# Patient Record
Sex: Male | Born: 1980 | Race: White | Hispanic: No | Marital: Married | Smoking: Former smoker
Health system: Southern US, Community
[De-identification: ages and names within clinical notes are randomized; demographics above are authoritative.]

## PROBLEM LIST (undated history)

## (undated) DIAGNOSIS — F410 Panic disorder [episodic paroxysmal anxiety] without agoraphobia: Secondary | ICD-10-CM

## (undated) HISTORY — PX: TYMPANOPLASTY WITH GRAFT: SHX6567

## (undated) HISTORY — PX: TONSILLECTOMY: SHX5217

## (undated) HISTORY — DX: Panic disorder (episodic paroxysmal anxiety): F41.0

## (undated) HISTORY — PX: TYMPANOSTOMY TUBE PLACEMENT: SHX32

## (undated) HISTORY — PX: HERNIA REPAIR: SHX51

---

## 2002-12-20 ENCOUNTER — Emergency Department (HOSPITAL_COMMUNITY): Admission: EM | Admit: 2002-12-20 | Discharge: 2002-12-20 | Payer: Self-pay | Admitting: Emergency Medicine

## 2002-12-20 ENCOUNTER — Encounter: Payer: Self-pay | Admitting: Emergency Medicine

## 2005-01-21 ENCOUNTER — Ambulatory Visit: Payer: Self-pay | Admitting: Family Medicine

## 2005-02-04 ENCOUNTER — Ambulatory Visit: Payer: Self-pay | Admitting: Family Medicine

## 2005-04-16 ENCOUNTER — Ambulatory Visit: Payer: Self-pay | Admitting: Family Medicine

## 2007-01-11 ENCOUNTER — Ambulatory Visit: Payer: Self-pay | Admitting: Family Medicine

## 2007-01-28 ENCOUNTER — Ambulatory Visit (HOSPITAL_BASED_OUTPATIENT_CLINIC_OR_DEPARTMENT_OTHER): Admission: RE | Admit: 2007-01-28 | Discharge: 2007-01-28 | Payer: Self-pay | Admitting: Otolaryngology

## 2008-01-05 ENCOUNTER — Telehealth: Payer: Self-pay | Admitting: Family Medicine

## 2008-01-11 ENCOUNTER — Ambulatory Visit: Payer: Self-pay | Admitting: Family Medicine

## 2008-01-11 DIAGNOSIS — F411 Generalized anxiety disorder: Secondary | ICD-10-CM | POA: Insufficient documentation

## 2008-11-30 ENCOUNTER — Ambulatory Visit: Payer: Self-pay | Admitting: Internal Medicine

## 2008-11-30 DIAGNOSIS — L723 Sebaceous cyst: Secondary | ICD-10-CM | POA: Insufficient documentation

## 2009-01-14 ENCOUNTER — Ambulatory Visit: Payer: Self-pay | Admitting: Family Medicine

## 2009-03-05 DIAGNOSIS — M51379 Other intervertebral disc degeneration, lumbosacral region without mention of lumbar back pain or lower extremity pain: Secondary | ICD-10-CM | POA: Insufficient documentation

## 2009-03-05 DIAGNOSIS — M5137 Other intervertebral disc degeneration, lumbosacral region: Secondary | ICD-10-CM | POA: Insufficient documentation

## 2009-03-06 ENCOUNTER — Ambulatory Visit: Payer: Self-pay | Admitting: Family Medicine

## 2009-03-08 ENCOUNTER — Ambulatory Visit: Payer: Self-pay | Admitting: Family Medicine

## 2009-03-11 ENCOUNTER — Telehealth (INDEPENDENT_AMBULATORY_CARE_PROVIDER_SITE_OTHER): Payer: Self-pay | Admitting: *Deleted

## 2009-03-14 ENCOUNTER — Telehealth: Payer: Self-pay | Admitting: Family Medicine

## 2009-03-21 ENCOUNTER — Encounter: Admission: RE | Admit: 2009-03-21 | Discharge: 2009-04-15 | Payer: Self-pay | Admitting: Family Medicine

## 2009-04-16 ENCOUNTER — Encounter: Payer: Self-pay | Admitting: Family Medicine

## 2009-05-06 ENCOUNTER — Ambulatory Visit: Payer: Self-pay | Admitting: Family Medicine

## 2009-05-06 DIAGNOSIS — M546 Pain in thoracic spine: Secondary | ICD-10-CM | POA: Insufficient documentation

## 2009-08-02 ENCOUNTER — Ambulatory Visit: Payer: Self-pay | Admitting: Family Medicine

## 2010-04-14 ENCOUNTER — Ambulatory Visit: Payer: Self-pay | Admitting: Family Medicine

## 2010-06-24 ENCOUNTER — Ambulatory Visit: Payer: Self-pay | Admitting: Family Medicine

## 2010-06-24 DIAGNOSIS — H6980 Other specified disorders of Eustachian tube, unspecified ear: Secondary | ICD-10-CM | POA: Insufficient documentation

## 2010-10-21 ENCOUNTER — Ambulatory Visit
Admission: RE | Admit: 2010-10-21 | Discharge: 2010-10-21 | Payer: Self-pay | Source: Home / Self Care | Attending: Family Medicine | Admitting: Family Medicine

## 2010-10-23 ENCOUNTER — Encounter
Admission: RE | Admit: 2010-10-23 | Discharge: 2010-10-28 | Payer: Self-pay | Source: Home / Self Care | Attending: Nurse Practitioner | Admitting: Nurse Practitioner

## 2010-10-28 NOTE — Assessment & Plan Note (Signed)
Summary: check bumps//ccm   Vital Signs:  Patient profile:   30 year old male Height:      74 inches Weight:      250 pounds BMI:     32.21 Temp:     98.3 degrees F oral BP sitting:   120 / 84  (left arm) Cuff size:   regular  Vitals Entered By: Kern Reap CMA Duncan Dull) (April 14, 2010 4:23 PM) CC: rash on left arm   CC:  rash on left arm.  History of Present Illness: Darius Stone is a 30 year old male, who comes in today for evaluation of rash on his left antecubital fossa.  Six days ago he woke up in the morning and noticed 5 red bumps in the antecubital fossa.  There is no preceding pain.  It's not pruritic... The rash is  the same  now than it was 6 days ago.  Allergies: 1)  ! Penicillin  Review of Systems      See HPI  Physical Exam  General:  Well-developed,well-nourished,in no acute distress; alert,appropriate and cooperative throughout examination Skin:  5 papular lesions, left antecubital fossa, consistent with spider bites   Problems:  Medical Problems Added: 1)  Dx of Bite of Nonvenomous Arthropod  (ICD-E906.4)  Impression & Recommendations:  Problem # 1:  BITE OF NONVENOMOUS ARTHROPOD (ICD-E906.4) Assessment New  Patient Instructions: 1)  apply cortisone cream twice daily............ call immediately if any sign of infection

## 2010-10-28 NOTE — Assessment & Plan Note (Signed)
Summary: ear clogged//ccm   Vital Signs:  Patient profile:   30 year old male Weight:      220 pounds Temp:     98.1 degrees F oral BP sitting:   120 / 88  (left arm) Cuff size:   regular  Vitals Entered By: Kern Reap CMA Duncan Dull) (June 24, 2010 12:19 PM) CC: clogged ear, drainage   CC:  clogged ear and drainage.  History of Present Illness: Darius Stone is a 30 year old male, who comes in today for evaluation of hearing loss in his right ear x 2 days.  Sunday he noticed some hearing loss in his right ear.  The week prior.  He had a head cold with a lot of nasal edema.  No fever, chills, or pain.  Allergies: 1)  ! Penicillin  Past History:  Past medical, surgical, family and social histories (including risk factors) reviewed for relevance to current acute and chronic problems.  Past Medical History: Reviewed history from 01/11/2008 and no changes required. PE tubes for serous otitis media, perforation that did not heal, repair, now 8 tonsillectomy right and left hernia repair history of panic attacks  Family History: Reviewed history from 01/11/2008 and no changes required. father has a history of degenerative joint disease.  Has had both hips replaced and he says his father drinks alcohol excessively.  Mother in good health.  No brothers.  One sister, who had melanoma  Social History: Reviewed history from 01/11/2008 and no changes required. Occupation: Single Former Smoker Alcohol use-yes Drug use-yes Regular exercise-yes  Review of Systems      See HPI  Physical Exam  General:  Well-developed,well-nourished,in no acute distress; alert,appropriate and cooperative throughout examination Head:  Normocephalic and atraumatic without obvious abnormalities. No apparent alopecia or balding. Eyes:  No corneal or conjunctival inflammation noted. EOMI. Perrla. Funduscopic exam benign, without hemorrhages, exudates or papilledema. Vision grossly normal. Ears:   left ear canal and eardrum normal.  Right ear canal normal ............. tympanic membrane not movable   Problems:  Medical Problems Added: 1)  Dx of Eustachian Tube Dysfunction, Right  (ICD-381.81)  Impression & Recommendations:  Problem # 1:  EUSTACHIAN TUBE DYSFUNCTION, RIGHT (ICD-381.81) Assessment New  Complete Medication List: 1)  Flonase 50 Mcg/act Susp (Fluticasone propionate) .... Uad  Patient Instructions: 1)  begin Zyrtec 10 mg a day at bedtime, one shot of Afrin nasal spray, up y  right nostril bedtime, followed by one shot  of the steroid nasal spray up y  right nostril at bedtime.  Do this until you hearing returns to normal. 2)  Stay in a complete smoke-free environment Prescriptions: FLONASE 50 MCG/ACT SUSP (FLUTICASONE PROPIONATE) UAD  #1 x 1   Entered and Authorized by:   Roderick Pee MD   Signed by:   Roderick Pee MD on 06/24/2010   Method used:   Print then Give to Patient   RxID:   [id number]

## 2010-10-30 NOTE — Assessment & Plan Note (Signed)
Summary: back pain/115p/njr   Vital Signs:  Patient profile:   30 year old male Weight:      280 pounds Temp:     98.2 degrees F oral BP sitting:   120 / 86  (left arm) Cuff size:   regular  Vitals Entered By: Kern Reap CMA Duncan Dull) (October 21, 2010 1:26 PM) CC: lower back pain   CC:  lower back pain.  History of Present Illness: Darius Stone is a 30 year old single male, nonsmoker, who works as a Science writer for a [employer], who comes in today for reevaluation of back pain.  About 16 months ago.  We saw him for the same problem.  Evaluation and it was negative at that time, however, because of the persistent pain.  I referred him for a neurosurgical evaluation.  He saw Dr. Danielle Dess, who did a complete diagnostic workup, including an MRI and totally thought his spine was fairly normal except for the L5-S1 disk.  He advised physical therapy and weight loss.  He went to one physical therapy session.  The pain went away and he never went back.  He's not lost any weight.  His current weight is 280 pounds in about 4 months ago.  The pain recurred.  He describes it now is constant right lumbar 4 on a scale of one to 10 and dull.  It seems to radiate down his right lateral thigh.  Neurologic review of systems is negative  Allergies: 1)  ! Penicillin  Past History:  Past medical, surgical, family and social histories (including risk factors) reviewed for relevance to current acute and chronic problems.  Past Medical History: Reviewed history from 01/11/2008 and no changes required. PE tubes for serous otitis media, perforation that did not heal, repair, now 8 tonsillectomy right and left hernia repair history of panic attacks  Family History: Reviewed history from 01/11/2008 and no changes required. father has a history of degenerative joint disease.  Has had both hips replaced and he says his father drinks alcohol excessively.  Mother in good health.  No brothers.  One sister, who  had melanoma  Social History: Reviewed history from 01/11/2008 and no changes required. Occupation: Single Former Smoker Alcohol use-yes Drug use-yes Regular exercise-yes  Review of Systems      See HPI  Physical Exam  General:  Well-developed,well-nourished,in no acute distress; alert,appropriate and cooperative throughout examination Msk:  No deformity or scoliosis noted of thoracic or lumbar spine.   Pulses:  R and L carotid,radial,femoral,dorsalis pedis and posterior tibial pulses are full and equal bilaterally Extremities:  No clubbing, cyanosis, edema, or deformity noted with normal full range of motion of all joints.   Neurologic:  No cranial nerve deficits noted. Station and gait are normal. Plantar reflexes are down-going bilaterally. DTRs are symmetrical throughout. Sensory, motor and coordinative functions appear intact.   Impression & Recommendations:  Problem # 1:  DISC DISEASE, LUMBAR (ICD-722.52) Assessment Deteriorated  Orders: Physical Therapy Referral (PT)  Complete Medication List: 1)  Flonase 50 Mcg/act Susp (Fluticasone propionate) .... Uad 2)  Flexeril 10 Mg Tabs (Cyclobenzaprine hcl) .Marland Kitchen.. 1 tab @ bedtime 3)  Vicodin Es 7.5-750 Mg Tabs (Hydrocodone-acetaminophen) .Marland Kitchen.. 1 tab @ bedtime  Patient Instructions: 1)  begin Motrin 800 mg twice daily with food, and a half of a Flexeril and Vicodin at bedtime. 2)  Avoid sitting. 3)  At work get up and walk around every 30 minutes. 4)  I will get u set up to see Jeanene Erb,  physical therapist Prescriptions: VICODIN ES 7.5-750 MG TABS (HYDROCODONE-ACETAMINOPHEN) 1 tab @ bedtime  #40 x 1   Entered and Authorized by:   Roderick Pee MD   Signed by:   Roderick Pee MD on 10/21/2010   Method used:   Print then Give to Patient   RxID:   [id number] FLEXERIL 10 MG TABS (CYCLOBENZAPRINE HCL) 1 tab @ bedtime  #40 x 1   Entered and Authorized by:   Roderick Pee MD   Signed by:   Roderick Pee MD on  10/21/2010   Method used:   Print then Give to Patient   RxID:   [id number]    Orders Added: 1)  Physical Therapy Referral [PT] 2)  Est. Patient Level III [88416]

## 2011-02-13 NOTE — Op Note (Signed)
NAME:  DEVELL, PARKERSON NO.:  [id number]   MEDICAL RECORD NO.:  [id number]          PATIENT TYPE:  AMB   LOCATION:  [facility]                          FACILITY:  [facility]   PHYSICIAN:  Christopher E. Ezzard Standing, M.D.DATE OF BIRTH:  02/20/1981   DATE OF PROCEDURE:  01/28/2007  DATE OF DISCHARGE:                               OPERATIVE REPORT   PREOPERATIVE DIAGNOSIS:  Chronic left tympanic membrane perforation.   POSTOPERATIVE DIAGNOSIS:  Chronic left tympanic membrane perforation.   OPERATION:  Left medial graft tympanoplasty.   SURGEON:  Narda Bonds, M.D.   ANESTHESIA:  General endotracheal.   COMPLICATIONS:  None.   BRIEF CLINICAL NOTE:  Gamble Enderle is a 30 year old gentleman, who has  had a chronic left TM perforation for a number of years.  He has had  previous history of ear infections with multiple myringotomy tubes.  I  suspect the perforation is residual effect of myringotomy tubes.  On  examination, he has a dry central anterior TM perforation of  approximately 30%.  He has had intermittent drainage from the performed  but no drainage presently.  He is taken to the operating room at this  time for a left tympanoplasty.   DESCRIPTION OF PROCEDURE:  After adequate endotracheal anesthesia,  Taydon received 1 g of Ancef IV preoperatively.  Ear canal was prepped  and draped with Betadine solution and draped out with sterile towels.  Ear canal was then further injected with Xylocaine with epinephrine for  local anesthetic, and the ear canal was irrigated with saline.  The  perforation was evaluated.  This was anterior-inferior TM perforation.  The edges of the perforation were freshened up with a pick and cup  forceps.  A posterior based tympanomeatal flap was then elevated down to  the annulus.  Cotton pledgets soaked in adrenaline were placed for  hemostasis.  A small postauricular incision was made to harvest a  temporalis fascia graft.  This was harvested, and  the graft site was  closed with 3-0 chromic sutures subcutaneously and 5-0 nylon to  reapproximate the skin edges.  Next, ear canal was approached again.  The packs soaked in adrenalin were removed, and annulus was elevated out  of the annulus sulcus and tympanomeatal flap was elevated anteriorly.  The incus and the stapes were intact and mobile.  The fascial graft was  cut to appropriate size and was placed medial to the tympanomeatal flap  and underneath the perforation site.  The tympanomeatal flap was brought  back down, the fascial graft covered the entire perforation.  The middle  ear space was then packed with Gelfoam soaked in Ciprodex drops.  The  tympanomeatal flap was brought back down; photo was obtained of the  graft covering the perforation.  Ear canal was then packed with Gelfoam  soaked in Ciprodex.  This completed the procedure.  Mastoid dressing was  applied.  Barth was awoken from anesthesia and transferred to the  recovery room postop doing well.   DISPOSITION:  Harout is discharged home later this morning on Keflex 500  mg b.i.d. x5 days,  Tylenol and Vicodin p.r.n. pain.  We will have him  follow up in my office in 6 days for recheck and have the postauricular  sutures removed.           ______________________________  Kristine Garbe Ezzard Standing, M.D.     CEN/MEDQ  D:  01/28/2007  T:  01/28/2007  Job:  [id number]   cc:   Tinnie Gens A. Tawanna Cooler, MD

## 2011-10-13 ENCOUNTER — Telehealth: Payer: Self-pay | Admitting: Family Medicine

## 2011-10-13 NOTE — Telephone Encounter (Signed)
Started at midnight, may have food poisoning or the flu. Having vomiting and diarrhea. Wants to see Dr Tawanna Cooler this morning asap. Please advise. Thanks.

## 2011-10-13 NOTE — Telephone Encounter (Signed)
Spoke with patient and he will try a clear liquid diet today and call back if no improvement

## 2012-02-26 ENCOUNTER — Telehealth: Payer: Self-pay | Admitting: Family

## 2012-02-26 ENCOUNTER — Encounter: Payer: Self-pay | Admitting: Family

## 2012-02-26 ENCOUNTER — Ambulatory Visit (INDEPENDENT_AMBULATORY_CARE_PROVIDER_SITE_OTHER): Payer: Managed Care, Other (non HMO) | Admitting: Family

## 2012-02-26 ENCOUNTER — Ambulatory Visit (INDEPENDENT_AMBULATORY_CARE_PROVIDER_SITE_OTHER)
Admission: RE | Admit: 2012-02-26 | Discharge: 2012-02-26 | Disposition: A | Discharge status: Home / Self Care | Payer: Managed Care, Other (non HMO) | Source: Ambulatory Visit | Attending: Family | Admitting: Family

## 2012-02-26 VITALS — BP 122/74 | HR 80 | Ht 74.0 in | Wt 261.0 lb

## 2012-02-26 DIAGNOSIS — M79609 Pain in unspecified limb: Secondary | ICD-10-CM

## 2012-02-26 DIAGNOSIS — M79671 Pain in right foot: Secondary | ICD-10-CM

## 2012-02-26 MED ORDER — DICLOFENAC SODIUM 75 MG PO TBEC: 75.0000 mg | DELAYED_RELEASE_TABLET | Freq: Two times a day (BID) | ORAL | Status: DC

## 2012-02-26 MED ORDER — HYDROCODONE-ACETAMINOPHEN 5-500 MG PO TABS: 1.0000 | ORAL_TABLET | Freq: Three times a day (TID) | ORAL | Status: AC | PRN

## 2012-02-26 NOTE — Progress Notes (Signed)
  Subjective:    Patient ID: Darius Stone, male    DOB: [birth year], 31 y.o.   MRN: [id number]  HPI 31 year old [place] male, nonsmoker, patient of Dr. Tawanna Cooler is in today with complaints of right foot pain x2 days. He describes the pain as achy and sharp, rates it a 7/10. The pain is worse with with eversion, flexion and extension. He has not taken any medication for relief. Denies any current or previous injury. 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  Review of Systems  Constitutional: Negative.   HENT: Negative.   Respiratory: Negative.   Cardiovascular: Negative.   Gastrointestinal: Negative.   Musculoskeletal:       Right foot pain  Skin: Negative.   Neurological: Negative.   Hematological: Negative.   Psychiatric/Behavioral: Negative.    No past medical history on file.  History   Social History  . Marital Status: Single    Spouse Name: N/A    Number of Children: N/A  . Years of Education: N/A   Occupational History  . Not on file.   Social History Main Topics  . Smoking status: Former Games developer  . Smokeless tobacco: Not on file  . Alcohol Use: Yes  . Drug Use: Yes  . Sexually Active: Not on file   Other Topics Concern  . Not on file   Social History Narrative  . No narrative on file    No past surgical history on file.  No family history on file.  Allergies  Allergen Reactions  . Penicillins     No current outpatient prescriptions on file prior to visit.    BP 122/74  Pulse 80  Ht 6\' 2"  (1.88 m)  Wt 261 lb (118.389 kg)  BMI 33.51 kg/m2  SpO2 98%chart    Objective:   Physical Exam  Constitutional: He is oriented to person, place, and time. He appears well-developed and well-nourished.  Neck: Normal range of motion. Neck supple.  Cardiovascular: Normal rate, regular rhythm and normal heart sounds.   Pulmonary/Chest: Effort normal and breath sounds normal.  Abdominal: Soft. Bowel sounds are normal.  Musculoskeletal:   Pain with eversion, flexion and extension of the right foot. Minimal swelling.  Neurological: He is alert and oriented to person, place, and time.  Skin: Skin is warm and dry.  Psychiatric: He has a normal mood and affect.          Assessment & Plan:  Assessment: Right foot pain  Plan: X-ray of the right will notify patient of the results. Voltaren 75 mg one tablet twice a day. Vicodin one tablet every 8 hours when necessary pain. Out of work Quarry manager. Rest. Ace wrap. Patient call the office if symptoms worsen or persist. Recheck a schedule, when necessary.

## 2012-02-26 NOTE — Patient Instructions (Signed)
Sprain  A sprain happens when the bands of tissue that connect bones and hold joints together (ligaments) stretch too much or tear.  HOME CARE   Raise (elevate) the injured area to lessen puffiness (swelling).   Put ice on the injured area.   Put ice in a plastic bag.   Place a towel between your skin and the bag.   Leave the ice on for 15 to 20 minutes, 3 to 4 times a day.   Do this for the first 24 hours or as told by your doctor.   Wear any splints, braces, castings, or elastic wraps as told by your child's doctor.   Eat healthy foods.   Only take medicine as told by your doctor.  GET HELP RIGHT AWAY IF:    There is numbness or tingling in the injured limb.   The toes or fingers become blue or [place] in the injured limb.   The sprained limb is cold to the touch.   There is a sharp, shooting pain in the injured limb.   The puffiness is getting worse instead of better.  MAKE SURE YOU:    Understand these instructions.   Will watch this condition.   Will get help right away if you are not doing well or get worse.  Document Released: 03/02/2008 Document Revised: 09/03/2011 Document Reviewed: 07/31/2009  [employer] Patient Information 2012 [employer].

## 2012-02-26 NOTE — Telephone Encounter (Signed)
SEE X-RAY NOTE

## 2012-02-26 NOTE — Telephone Encounter (Signed)
Pt is call requesting xray(foot) results

## 2012-04-07 ENCOUNTER — Ambulatory Visit (INDEPENDENT_AMBULATORY_CARE_PROVIDER_SITE_OTHER): Payer: Managed Care, Other (non HMO) | Admitting: Family Medicine

## 2012-04-07 ENCOUNTER — Encounter: Payer: Self-pay | Admitting: Family Medicine

## 2012-04-07 VITALS — BP 120/80 | Temp 98.0°F | Ht 74.5 in | Wt 256.0 lb

## 2012-04-07 DIAGNOSIS — Z0000 Encounter for general adult medical examination without abnormal findings: Secondary | ICD-10-CM

## 2012-04-07 DIAGNOSIS — Z23 Encounter for immunization: Secondary | ICD-10-CM

## 2012-04-07 LAB — CBC WITH DIFFERENTIAL/PLATELET
Basophils Absolute: 0 10*3/uL (ref 0.0–0.1)
Basophils Relative: 0.3 % (ref 0.0–3.0)
Eosinophils Absolute: 0.2 10*3/uL (ref 0.0–0.7)
Eosinophils Relative: 2.6 % (ref 0.0–5.0)
HCT: 46.9 % (ref 39.0–52.0)
Hemoglobin: 15.9 g/dL (ref 13.0–17.0)
Lymphocytes Relative: 28.2 % (ref 12.0–46.0)
Lymphs Abs: 1.7 10*3/uL (ref 0.7–4.0)
MCHC: 33.9 g/dL (ref 30.0–36.0)
Monocytes Relative: 9.9 % (ref 3.0–12.0)
Neutro Abs: 3.6 10*3/uL (ref 1.4–7.7)
Neutrophils Relative %: 59 % (ref 43.0–77.0)
Platelets: 217 10*3/uL (ref 150.0–400.0)
RBC: 5.59 Mil/uL (ref 4.22–5.81)
RDW: 13.4 % (ref 11.5–14.6)
WBC: 6.2 10*3/uL (ref 4.5–10.5)

## 2012-04-07 LAB — LIPID PANEL
Cholesterol: 197 mg/dL (ref 0–200)
HDL: 66.7 mg/dL (ref >39.00)
LDL Cholesterol: 109 mg/dL — ABNORMAL HIGH (ref 0–99)
Total CHOL/HDL Ratio: 3
Triglycerides: 106 mg/dL (ref 0.0–149.0)
VLDL: 21.2 mg/dL (ref 0.0–40.0)

## 2012-04-07 LAB — POCT URINALYSIS DIPSTICK
Bilirubin, UA: NEGATIVE
Blood, UA: NEGATIVE
Ketones, UA: NEGATIVE
Leukocytes, UA: NEGATIVE
Protein, UA: NEGATIVE
Spec Grav, UA: 1.025
Urobilinogen, UA: 0.2
pH, UA: 5.5

## 2012-04-07 LAB — BASIC METABOLIC PANEL
BUN: 20 mg/dL (ref 6–23)
CO2: 26 mEq/L (ref 19–32)
Chloride: 104 mEq/L (ref 96–112)
Creatinine, Ser: 1.1 mg/dL (ref 0.4–1.5)
Glucose, Bld: 101 mg/dL — ABNORMAL HIGH (ref 70–99)
Potassium: 3.8 mEq/L (ref 3.5–5.1)

## 2012-04-07 LAB — HEPATIC FUNCTION PANEL
ALT: 46 U/L (ref 0–53)
AST: 33 U/L (ref 0–37)
Albumin: 4.5 g/dL (ref 3.5–5.2)
Alkaline Phosphatase: 68 U/L (ref 39–117)
Bilirubin, Direct: 0.1 mg/dL (ref 0.0–0.3)
Total Protein: 7.7 g/dL (ref 6.0–8.3)

## 2012-04-07 NOTE — Patient Instructions (Signed)
We will call you within a week with your laboratories  Do a thorough skin exam monthly  Return yearly for general physical examination  Have your barber check your scalp every time he gets her hair cut remember the 2 lesions one above your right ear and one behind her left ear  If the migraine headaches get more frequent or severe call for some specific migraine medications if her mild continue to use the over-the-counter medications  Begin a walking program

## 2012-04-07 NOTE — Progress Notes (Signed)
Subjective:    Patient ID: Darius Stone, male    DOB: August 30, 1981, 31 y.o.   MRN: [id number]  HPI hakan  is a 31 year old married male nonsmoker who comes in today for general physical examination  He's always been in excellent health he's had no chronic health problems. He had bilateral hernia surgery as a child and PE tubes x6  Other than that no major illnesses injuries. No medicine allergies he's never had penicillin because his father has a history of penicillin allergy and he was told not to take it because his dad was allergic. 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 Family history pertinent a sister was diagnosed at age 65 with a melanoma of her scalp. He has had 2 hip operations otherwise no family history of coronary disease. Last tetanus booster unknown booster given today  Review of systems negative except for recent migraine headache preceded by a visual aura   Review of Systems  Constitutional: Negative.   Eyes: Negative.   Respiratory: Negative.   Cardiovascular: Negative.   Gastrointestinal: Negative.   Genitourinary: Negative.   Musculoskeletal: Negative.   Skin: Negative.   Neurological: Positive for headaches.  Hematological: Negative.   Psychiatric/Behavioral: Negative.        Objective:   Physical Exam  Constitutional: He is oriented to person, place, and time. He appears well-developed and well-nourished.  HENT:  Head: Normocephalic and atraumatic.  Right Ear: External ear normal.  Left Ear: External ear normal.  Nose: Nose normal.  Mouth/Throat: Oropharynx is clear and moist.  Eyes: Conjunctivae and EOM are normal. Pupils are equal, round, and reactive to light.  Neck: Normal range of motion. Neck supple. No JVD present. No tracheal deviation present. No thyromegaly present.  Cardiovascular: Normal rate, regular rhythm, normal heart sounds and intact distal pulses.  Exam reveals no gallop  and no friction rub.   No murmur heard. Pulmonary/Chest: Effort normal and breath sounds normal. No stridor. No respiratory distress. He has no wheezes. He has no rales. He exhibits no tenderness.  Abdominal: Soft. Bowel sounds are normal. He exhibits no distension and no mass. There is no tenderness. There is no rebound and no guarding.  Genitourinary: Rectum normal, prostate normal and penis normal. Guaiac negative stool. No penile tenderness.  Musculoskeletal: Normal range of motion. He exhibits no edema and no tenderness.  Lymphadenopathy:    He has no cervical adenopathy.  Neurological: He is alert and oriented to person, place, and time. He has normal reflexes. No cranial nerve deficit. He exhibits normal muscle tone.  Skin: Skin is warm and dry. No rash noted. No erythema. No pallor.       We did a total body skin exam because of his family history of melanoma he has 2 tattoos one on the posterior right calf the other on his upper trunk both of which appear normal he has a garden variety of freckles moles skin tags and capillary hemangiomas. Also did a thorough exam of his scalp. He has 2 brown lesions one above his right ear and one behind his left ear there brown about 6 mm in diameter no dark pigment.  Psychiatric: He has a normal mood and affect. His behavior is normal. Judgment and thought content normal.          Assessment & Plan:  Healthy male  Family history of melanoma thorough skin exam monthly at home followup yearly here  Overweight  256 pounds check metabolic parameters begin diet and exercise program  Migraine headache asymptomatic now OTC medication return when necessary  History of inguinal hernia repair x2  History of PE tubes x6 normal hearing

## 2012-10-28 ENCOUNTER — Ambulatory Visit: Payer: Managed Care, Other (non HMO) | Admitting: Family Medicine

## 2012-12-04 ENCOUNTER — Encounter (HOSPITAL_COMMUNITY): Payer: Self-pay | Admitting: Emergency Medicine

## 2012-12-04 ENCOUNTER — Emergency Department (HOSPITAL_COMMUNITY)
Admission: EM | Admit: 2012-12-04 | Discharge: 2012-12-04 | Disposition: A | Discharge status: Home / Self Care | Payer: Managed Care, Other (non HMO) | Attending: Emergency Medicine | Admitting: Emergency Medicine

## 2012-12-04 DIAGNOSIS — W268XXA Contact with other sharp object(s), not elsewhere classified, initial encounter: Secondary | ICD-10-CM | POA: Insufficient documentation

## 2012-12-04 DIAGNOSIS — F1010 Alcohol abuse, uncomplicated: Secondary | ICD-10-CM | POA: Insufficient documentation

## 2012-12-04 DIAGNOSIS — Z87891 Personal history of nicotine dependence: Secondary | ICD-10-CM | POA: Insufficient documentation

## 2012-12-04 DIAGNOSIS — S61209A Unspecified open wound of unspecified finger without damage to nail, initial encounter: Secondary | ICD-10-CM | POA: Insufficient documentation

## 2012-12-04 DIAGNOSIS — Y92009 Unspecified place in unspecified non-institutional (private) residence as the place of occurrence of the external cause: Secondary | ICD-10-CM | POA: Insufficient documentation

## 2012-12-04 DIAGNOSIS — Y9389 Activity, other specified: Secondary | ICD-10-CM | POA: Insufficient documentation

## 2012-12-04 DIAGNOSIS — Z8659 Personal history of other mental and behavioral disorders: Secondary | ICD-10-CM | POA: Insufficient documentation

## 2012-12-04 MED ORDER — IBUPROFEN 600 MG PO TABS: 600.0000 mg | ORAL_TABLET | Freq: Four times a day (QID) | ORAL | Status: DC | PRN

## 2012-12-04 MED ORDER — OXYCODONE-ACETAMINOPHEN 5-325 MG PO TABS: 1.0000 | ORAL_TABLET | Freq: Four times a day (QID) | ORAL | Status: DC | PRN

## 2012-12-04 NOTE — ED Provider Notes (Signed)
History     CSN: [id number]  Arrival date & time 12/04/12  0000   First MD Initiated Contact with Patient 12/04/12 [phone]      Chief Complaint  Patient presents with  . Laceration    (Consider location/radiation/quality/duration/timing/severity/associated sxs/prior treatment) HPI Comments: Patient + EtOH presents with avulsion to left thumb that occurred while using a peeler. Patient reports loss of fingernail, + bleeding, some difficulty controlling with pressure. Last tetanus < 5 years ago per pt. Denies other injury. The onset of this condition was acute. The course is constant. Aggravating factors: none. Alleviating factors: none.    The history is provided by the patient.    Past Medical History  Diagnosis Date  . Panic attack     Past Surgical History  Procedure Laterality Date  . Tympanostomy tube placement    . Tonsillectomy    . Hernia repair      Family History  Problem Relation Age of Onset  . Alcohol abuse Father   . Cancer Sister     melanoma- scalp    History  Substance Use Topics  . Smoking status: Former Games developer  . Smokeless tobacco: Not on file  . Alcohol Use: Yes      Review of Systems  Constitutional: Negative for activity change.  HENT: Negative for neck pain.   Musculoskeletal: Positive for arthralgias. Negative for back pain, joint swelling and gait problem.  Skin: Positive for wound.  Neurological: Negative for weakness and numbness.    Allergies  Penicillins  Home Medications   Current Outpatient Rx  Name  Route  Sig  Dispense  Refill  . ibuprofen (ADVIL,MOTRIN) 200 MG tablet   Oral   Take 800 mg by mouth every 6 (six) hours as needed for pain.           BP 144/89  Pulse 77  Temp(Src) 98 F (36.7 C)  Resp 19  SpO2 99%  Physical Exam  Nursing note and vitals reviewed. Constitutional: He appears well-developed and well-nourished.  HENT:  Head: Normocephalic and atraumatic.  Eyes: Conjunctivae are normal.  Neck:  Normal range of motion. Neck supple.  Cardiovascular: Normal pulses.   Musculoskeletal: He exhibits tenderness. He exhibits no edema.       Left wrist: Normal. He exhibits normal range of motion and no tenderness.       Left hand: He exhibits laceration (avulsion). Normal sensation noted. Normal strength (Normal flexion, extension, opposition of thumb) noted.       Hands: Neurological: He is alert. No sensory deficit.  Motor, sensation, and vascular distal to the injury is fully intact.   Skin: Skin is warm and dry.  Psychiatric: He has a normal mood and affect.    ED Course  Procedures (including critical care time)  Labs Reviewed - No data to display No results found.   1. Fingertip avulsion, initial encounter     5:51 AM Patient seen and examined. Work-up initiated. Medications ordered.   Vital signs reviewed and are as follows: Filed Vitals:   12/04/12 0429  BP: 144/89  Pulse: 77  Temp:   Resp: 19   5:51 AM Digital block performed due to patient discomfort and need to explore and clean wound. Technique: ring block Finger: L thumb Area prepped with alcohol wipe and iodine Medication: 5mL of 1% lidocaine without epinephrine  Patient tolerated procedure well. Adequate anesthesia achieved.   Wound was soaked in saline and pressure dressing removed. Moderate bleeding resumed with pulsatile flow noted  without visible arterial. Pressure on thumb decreased bleeding. Wound explored. No bony involvement. Quick clot product applied and pressure held by myself and then by patient for several minutes.   Pt re-examined after 20 minutes of pressure. Some bleeding on gauze however area is not expanding. Patient with return of pain. Offered pain medication vs another digital block. Patient requests digital block.   Digital block performed Technique: single injection into base of finger into flexor tendon sheath Finger: L thumb Area prepped with alcohol wipe and iodine Medication:  3mL of 2% lidocaine without epinephrine Patient tolerated procedure well. Adequate anesthesia achieved.   Prior to discharge, gauze was checked again without signs of additional bleeding.   Patient counseled to maintain pressure dressing for 24 hrs and then soak gently and remove gently. Patient told to allow any remaining quick clot to degrade naturally/fall off.   Patient counseled on wound care.   The patient was urged to return to the Emergency Department urgently with worsening pain, swelling, expanding erythema especially if it streaks away from the affected area, fever, or if they have any other concerns. Patient verbalized understanding.   Patient counseled on use of narcotic pain medications. Counseled not to combine these medications with others containing tylenol. Urged not to drink alcohol, drive, or perform any other activities that requires focus while taking these medications. The patient verbalizes understanding and agrees with the plan.  Orthopedic hand f/u given if problem with wound healing.     MDM  Fingertip avulsion. No tendon or bony injury suspected. Tetanus UTD per patient. Arteriolar bleeding controlled with pressure and quick clot.   Do not suspect disruption of nail matrix.   Ortho hand f/u given if problem encountered with healing: granuloma, etc.         Renne Crigler, PA-C 12/05/12 2345

## 2012-12-04 NOTE — ED Notes (Signed)
Pt alert, arrives from home, c/o laceration to left thumb, onset was this evening, bleeding controlled, DSD applied, last tet ?

## 2012-12-07 NOTE — ED Provider Notes (Signed)
Medical screening examination/treatment/procedure(s) were performed by non-physician practitioner and as supervising physician I was immediately available for consultation/collaboration.   Hanley Seamen, MD 12/07/12 [phone]

## 2012-12-09 ENCOUNTER — Encounter: Payer: Self-pay | Admitting: Internal Medicine

## 2012-12-09 ENCOUNTER — Ambulatory Visit (INDEPENDENT_AMBULATORY_CARE_PROVIDER_SITE_OTHER): Payer: Managed Care, Other (non HMO) | Admitting: Internal Medicine

## 2012-12-09 VITALS — BP 142/80 | Temp 98.5°F | Wt 238.0 lb

## 2012-12-09 DIAGNOSIS — H6990 Unspecified Eustachian tube disorder, unspecified ear: Secondary | ICD-10-CM

## 2012-12-09 DIAGNOSIS — H6980 Other specified disorders of Eustachian tube, unspecified ear: Secondary | ICD-10-CM

## 2012-12-09 DIAGNOSIS — T148XXA Other injury of unspecified body region, initial encounter: Secondary | ICD-10-CM

## 2012-12-09 DIAGNOSIS — H6981 Other specified disorders of Eustachian tube, right ear: Secondary | ICD-10-CM

## 2012-12-09 MED ORDER — MOMETASONE FUROATE 50 MCG/ACT NA SUSP: 2.0000 | Freq: Every day | NASAL | Status: DC

## 2012-12-09 MED ORDER — CEPHALEXIN 500 MG PO CAPS: 1000.0000 mg | ORAL_CAPSULE | Freq: Two times a day (BID) | ORAL | Status: DC

## 2012-12-09 NOTE — Assessment & Plan Note (Signed)
32 year old Darius Stone male  was evaluated in emergency room on 12/04/2012 secondary to avulsion injury of left thumb. There was possible arterial bleed. "Clot stop" was used for hemostasis. He has mild redness on medial aspect of nailbed on exam today. I suggest he start Keflex to prevent wound infection. Reviewed dressing changes. Reassess in 2 weeks. Patient understands to contact our office should he develop any signs of infection.

## 2012-12-09 NOTE — Assessment & Plan Note (Signed)
Patient has signs and symptoms of right eustachian tube dysfunction. Use Nasonex as directed.

## 2012-12-09 NOTE — Patient Instructions (Addendum)
Keep area covered. Clean wound with warm water and soap. Contact our if you develop any redness, pain or fever

## 2012-12-09 NOTE — Progress Notes (Signed)
  Subjective:    Patient ID: Darius Stone, male    DOB: October 25, 1980, 32 y.o.   MRN: [id number]  HPI  32 year old Darius Stone male for emergency room followup. He was seen on 12/04/2012 after suffering an avulsion injury to left thumb. Patient was using vegetable peeler when it slipped.  Patient noted to have possible arterial bleed. "Clot stop" was used for hemostasis. Part of his nail is missing.  Patient reports mild tenderness. There is slight redness medial aspect of nailbed. He denies any purulent drainage.  Patient also complains of intermittent right ear pain. He had upper respiratory infection 1 to 2 weeks ago. He denies any hearing loss. He has history of frequent ear infections as a child.  Review of Systems Negative for fever or chills  Medical records reviewed-last tetanus shot was given in July of 2013  Past Medical History  Diagnosis Date  . Panic attack     History   Social History  . Marital Status: Single    Spouse Name: N/A    Number of Children: N/A  . Years of Education: N/A   Occupational History  . Not on file.   Social History Main Topics  . Smoking status: Former Games developer  . Smokeless tobacco: Not on file  . Alcohol Use: Yes  . Drug Use: Yes  . Sexually Active: Not on file   Other Topics Concern  . Not on file   Social History Narrative  . No narrative on file    Past Surgical History  Procedure Laterality Date  . Tympanostomy tube placement    . Tonsillectomy    . Hernia repair      Family History  Problem Relation Age of Onset  . Alcohol abuse Father   . Cancer Sister     melanoma- scalp    Allergies  Allergen Reactions  . Penicillins     Unknown reaction    Current Outpatient Prescriptions on File Prior to Visit  Medication Sig Dispense Refill  . ibuprofen (ADVIL,MOTRIN) 600 MG tablet Take 1 tablet (600 mg total) by mouth every 6 (six) hours as needed for pain.  20 tablet  0   No current facility-administered medications on file  prior to visit.    BP 142/80  Temp(Src) 98.5 F (36.9 C) (Oral)  Wt 238 lb (107.956 kg)  BMI 30.16 kg/m2       Objective:   Physical Exam  Constitutional: He appears well-developed and well-nourished.  HENT:  Head: Normocephalic and atraumatic.  Left Ear: External ear normal.  Right TM slightly erythematous and retracted  Cardiovascular: Normal rate, regular rhythm and normal heart sounds.   Pulmonary/Chest: Effort normal and breath sounds normal. He has no wheezes.  Skin:  Scab over left medial thumb,  Slight redness near medial nail bed.  Normal sensation.          Assessment & Plan:

## 2014-02-13 ENCOUNTER — Encounter: Payer: Self-pay | Admitting: Family Medicine

## 2014-02-13 ENCOUNTER — Ambulatory Visit (INDEPENDENT_AMBULATORY_CARE_PROVIDER_SITE_OTHER): Payer: Managed Care, Other (non HMO) | Admitting: Family Medicine

## 2014-02-13 VITALS — BP 110/80 | Temp 98.4°F | Wt 220.0 lb

## 2014-02-13 DIAGNOSIS — G43909 Migraine, unspecified, not intractable, without status migrainosus: Secondary | ICD-10-CM | POA: Insufficient documentation

## 2014-02-13 MED ORDER — RIZATRIPTAN BENZOATE 5 MG PO TBDP: 5.0000 mg | ORAL_TABLET | ORAL | Status: DC | PRN

## 2014-02-13 NOTE — Progress Notes (Signed)
Pre visit review using our clinic review tool, if applicable. No additional management support is needed unless otherwise documented below in the visit note. 

## 2014-02-13 NOTE — Patient Instructions (Signed)
Maxalt 5 mg,,,,,,,,,,, 1 stat at the onset of the first sign that you're getting a migraine  If you're migraines continue to be episodic and relieved by the Maxalt then that's all I would do if however it become more frequent more severe or unresponsive to the Maxalt return for reevaluation

## 2014-02-13 NOTE — Progress Notes (Signed)
   Subjective:    Patient ID: Darius Stone, male    DOB: 11/21/1980, 33 y.o.   MRN: [id number]  HPI Darius Stone is a 33 year old married male nonsmoker who comes in today for evaluation of new-onset migraine headaches  He states year and a half ago he had a visual disturbance that resolved in 15-20 minutes and no headache. Then last Friday night at 10:30 PM he had the visual disturbance followed 30 minutes later by a migraine headache. He had a good bed for 2 days because of photophobia and phonophobia.  He says his sister has migraine headaches also  He's a fairly good diet no excessive alcohol caffeine etc.   Review of Systems Review of systems otherwise negative    Objective:   Physical Exam  Well-developed well-nourished in no acute distress vital signs stable he is afebrile HEENT negative in particular his Desyrel normal neurologic exam included strength reflexes sensation muscle strength and gait are within normal limits cranial nerves normal      Assessment & Plan:  Migraine headache with aura plan Maxalt episodically

## 2014-03-14 IMAGING — CR DG FOOT COMPLETE 3+V*R*
3 series · 3 of 3 positions shown · non-contrast
Comparison: None.

CLINICAL DATA: Right foot pain

RIGHT FOOT COMPLETE - 3+ VIEW

[view not recorded (1 of 3)]
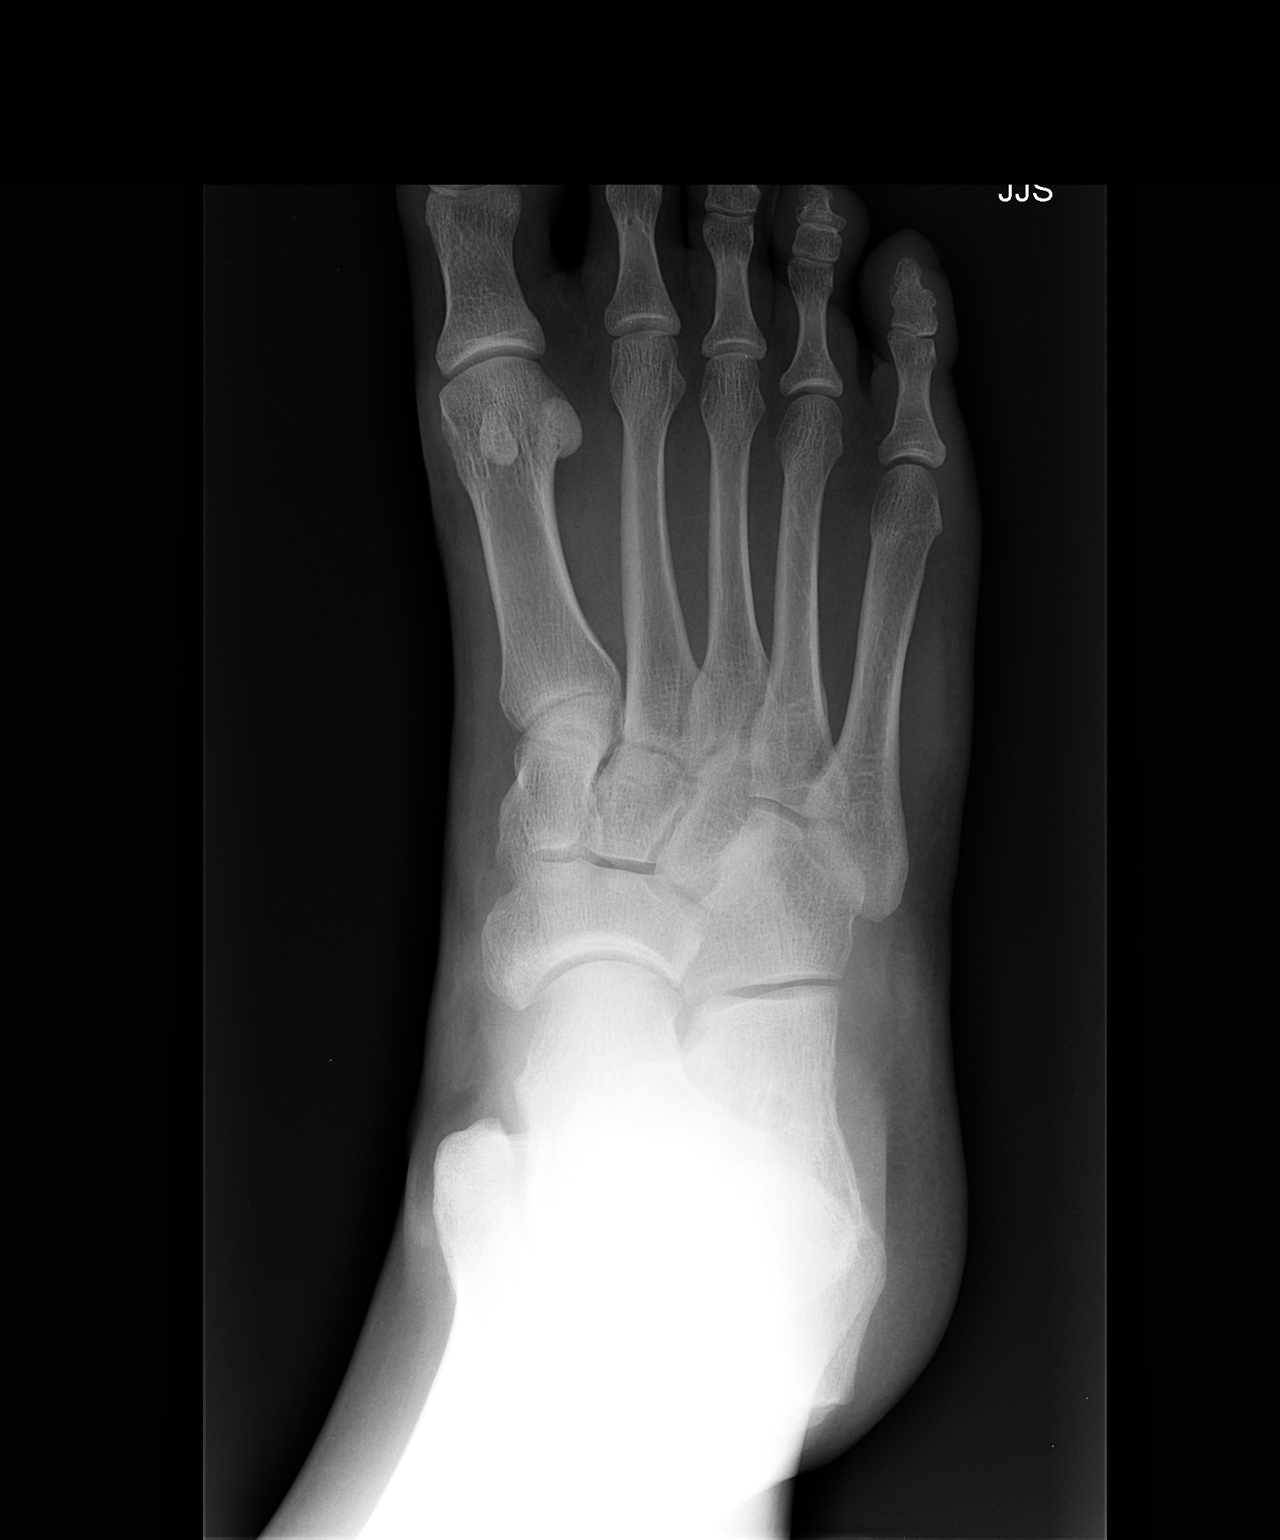

[view not recorded (2 of 3)]
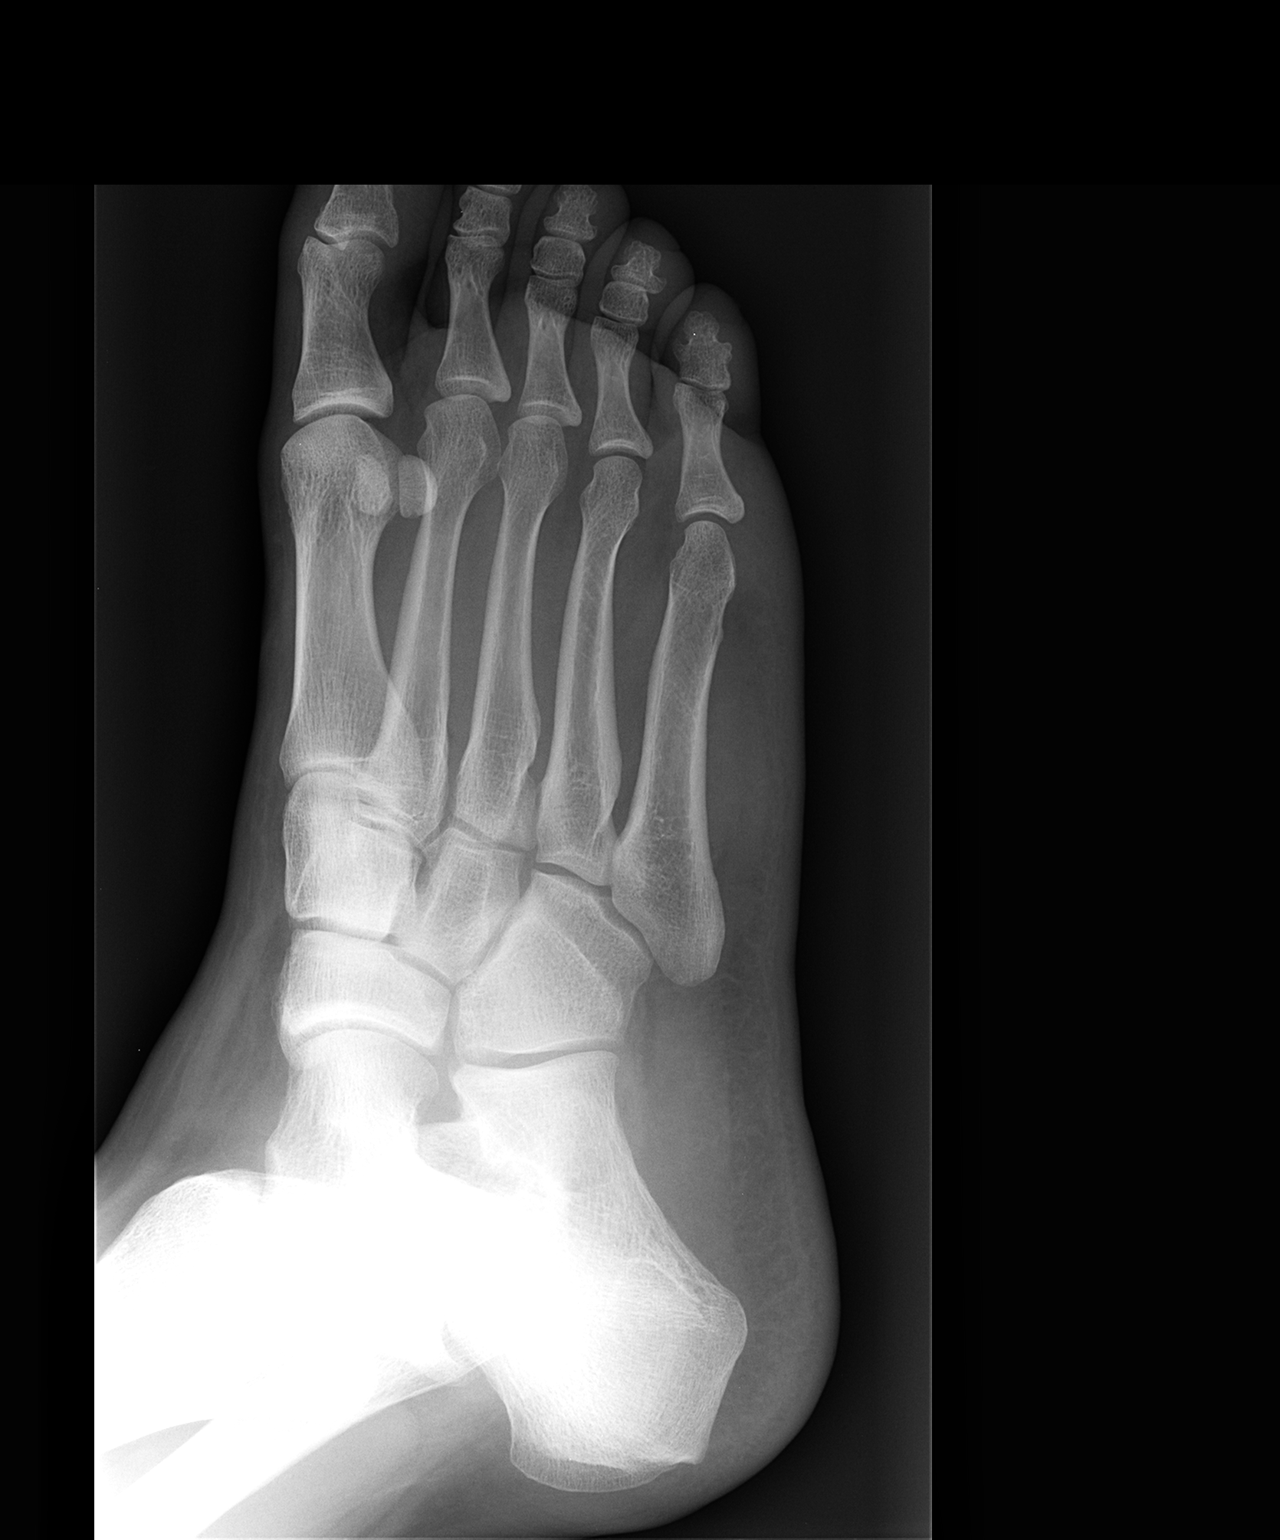

[view not recorded (3 of 3)]
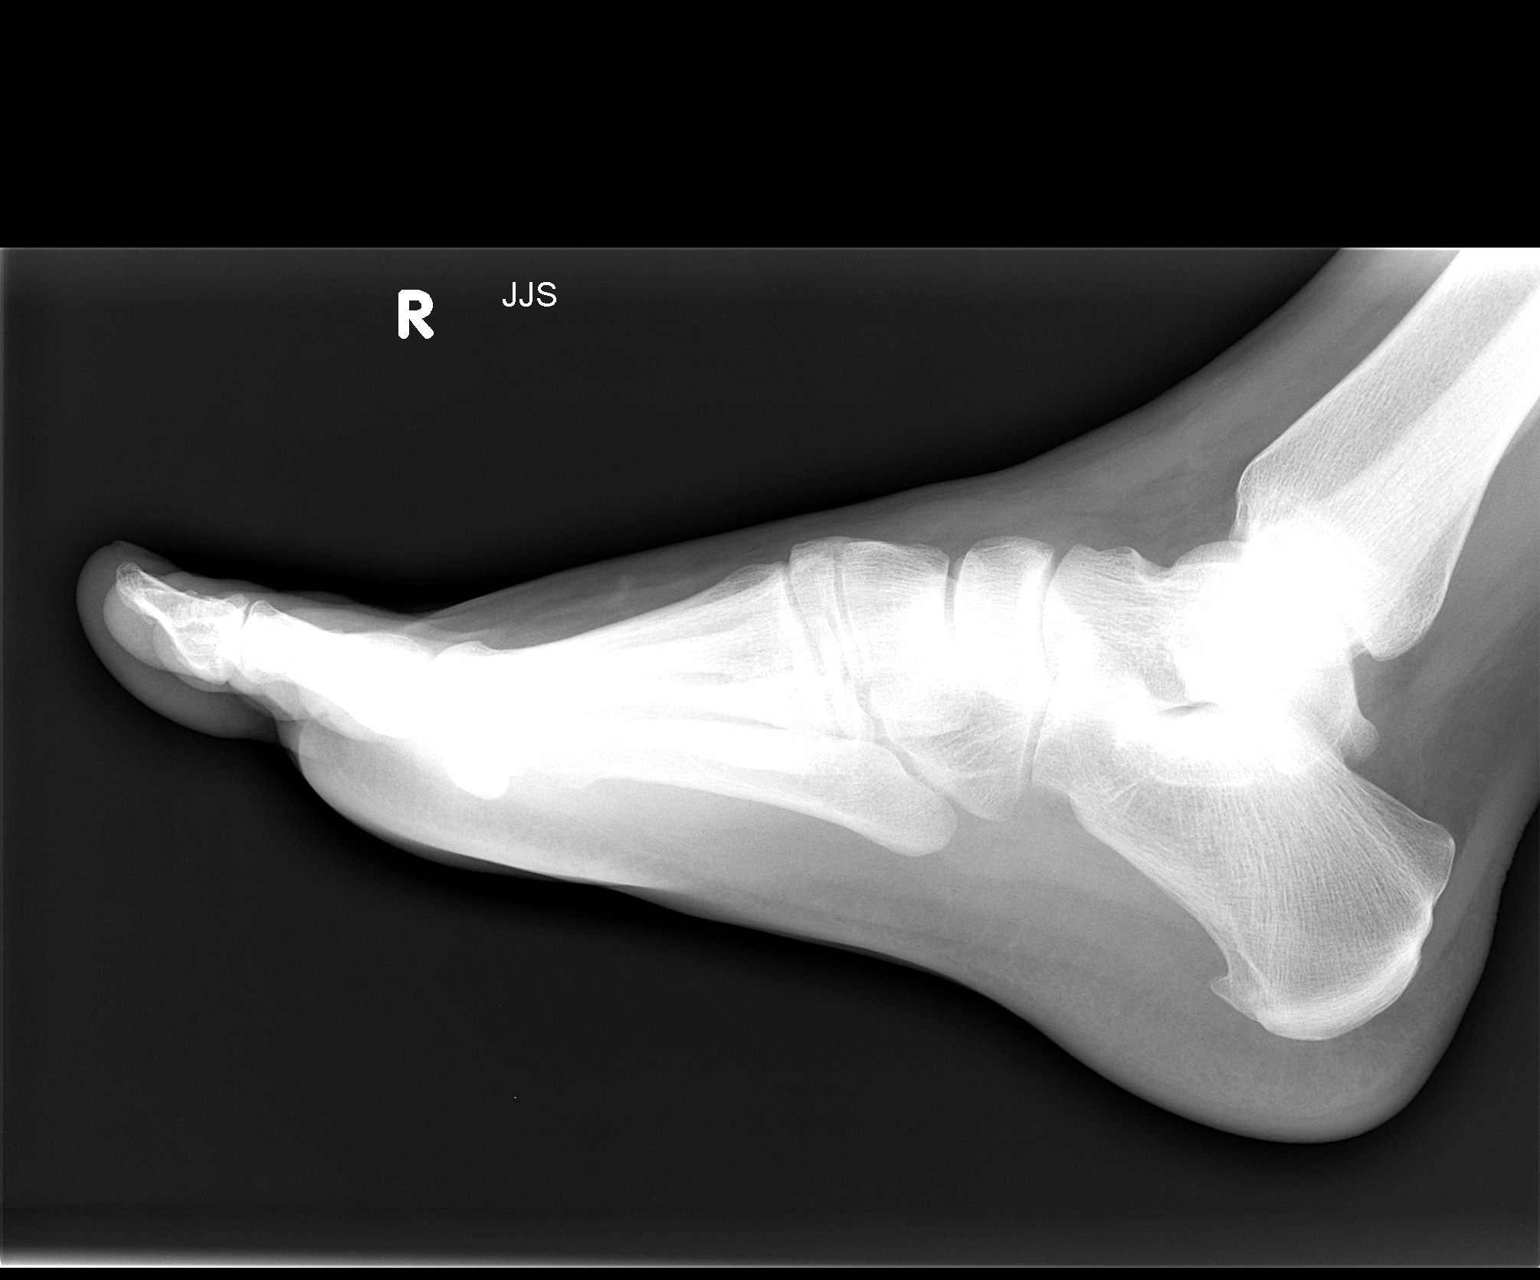

[3 of 3 positions shown; findings below may reference images not displayed]

FINDINGS: No evidence of fracture or stress fracture.  No
degenerative change.  No other focal lesion.
IMPRESSION: Negative radiographs

## 2014-11-30 ENCOUNTER — Encounter: Payer: Self-pay | Admitting: Family Medicine

## 2014-11-30 ENCOUNTER — Ambulatory Visit (INDEPENDENT_AMBULATORY_CARE_PROVIDER_SITE_OTHER): Payer: Managed Care, Other (non HMO) | Admitting: Family Medicine

## 2014-11-30 VITALS — BP 120/78 | HR 91 | Temp 99.0°F | Wt 281.0 lb

## 2014-11-30 DIAGNOSIS — J019 Acute sinusitis, unspecified: Secondary | ICD-10-CM

## 2014-11-30 MED ORDER — CEFUROXIME AXETIL 500 MG PO TABS: 500.0000 mg | ORAL_TABLET | Freq: Two times a day (BID) | ORAL | Status: DC

## 2014-11-30 NOTE — Progress Notes (Signed)
   Subjective:    Patient ID: Darius ByarsJarrod A Lunn, male    DOB: 01/16/1981, 34 y.o.   MRN: [id number]  HPI Acute visit. Patient seen with 3 to 4 week history of cough. He's had some productive cough during this time. Nonsmoker. He's had also sinus congestion for almost a month. Yellowish discharge off and on. Intermittent headaches. Increased malaise. Reported allergy to penicillin but he is not sure he ever had a true reaction to this. He has taken Keflex without difficulty in the past.   Review of Systems  Constitutional: Positive for fatigue. Negative for fever and chills.  HENT: Positive for congestion.   Respiratory: Positive for cough.   Neurological: Positive for headaches.       Objective:   Physical Exam  Constitutional: He appears well-developed and well-nourished.  HENT:  Mouth/Throat: Oropharynx is clear and moist.  Minimal scarring left eardrum from prior ear surgery. No acute changes  Neck: Neck supple.  Pulmonary/Chest: Effort normal and breath sounds normal. No respiratory distress. He has no wheezes. He has no rales.  Lymphadenopathy:    He has no cervical adenopathy.          Assessment & Plan:  Acute sinusitis. Given one month duration of symptoms, start Ceftin 500 mg twice a day for 10 days. Consider probiotics. Follow-up as needed

## 2014-11-30 NOTE — Progress Notes (Signed)
Pre visit review using our clinic review tool, if applicable. No additional management support is needed unless otherwise documented below in the visit note. 

## 2014-11-30 NOTE — Patient Instructions (Signed)

## 2016-03-11 ENCOUNTER — Encounter: Payer: Self-pay | Admitting: Internal Medicine

## 2016-03-11 ENCOUNTER — Ambulatory Visit (INDEPENDENT_AMBULATORY_CARE_PROVIDER_SITE_OTHER): Payer: Managed Care, Other (non HMO) | Admitting: Internal Medicine

## 2016-03-11 VITALS — BP 134/90 | Temp 97.8°F | Wt 297.6 lb

## 2016-03-11 DIAGNOSIS — F1090 Alcohol use, unspecified, uncomplicated: Secondary | ICD-10-CM

## 2016-03-11 DIAGNOSIS — L819 Disorder of pigmentation, unspecified: Secondary | ICD-10-CM

## 2016-03-11 DIAGNOSIS — Z789 Other specified health status: Secondary | ICD-10-CM

## 2016-03-11 NOTE — Patient Instructions (Addendum)
  No evidence of infection curious area wonder if it could be a thermal change. Could be a bite but there is no entry area nor tenderness or itching.   Agree we can watch this with close observation and follow-up if progressing or alarm symptoms in any way. Or not resolving  Avoid any extremes and pressure or temperature at this time and observe. Agree with decreasing heavy alcohol use because of your family history and health risk.

## 2016-03-11 NOTE — Progress Notes (Signed)
Pre visit review using our clinic review tool, if applicable. No additional management support is needed unless otherwise documented below in the visit note.  Chief Complaint  Patient presents with  . Bruise    HPI: Darius Stone 35 y.o.  Comes in for acute visit PCP NA Just noted this morning when after taking a shower of a bruise E like area with a central [place] area on his left calf. He drew a circle around it became concerned if it could been some kind of unusual bite or infection. 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 Although he tends to have very heavy drinking on the weekend he only drank 5 or 6 beverages 2 nights before he noted this. He doesn't take aspirin unless he has a hangover which is many weekend. No history of bruising bleeding fevers other findings. Multiple questioning he assures me that it does not hurt or itch. It hasn't changed over the last 6-8 hours and he almost canceled the appointment except for the acknowledgment there could've been a cancellation feet. ROS: See pertinent positives and negatives per HPI. He used to drink heavily every day now on weekends may go through pint t of liquor and 10-11 beers. This is per night. He gets hangovers and takes aspirin but no loss of consciousness or memory issues or other concerns. Most recently he only uses alcohol on weekends and didn't this past weekend. He has a family history of alcoholism. His aware and cutting back. Past Medical History  Diagnosis Date  . Panic attack     Family History  Problem Relation Age of Onset  . Alcohol abuse Father   . Cancer Sister     melanoma- scalp    Social History   Social History  . Marital Status: Single    Spouse Name: N/A  . Number of Children: N/A  . Years of Education: N/A   Social History Main Topics  . Smoking status: Former Games developermoker  . Smokeless tobacco: Not on file    . Alcohol Use: Yes  . Drug Use: Yes  . Sexual Activity: Not on file   Other Topics Concern  . Not on file   Social History Narrative    Outpatient Prescriptions Prior to Visit  Medication Sig Dispense Refill  . cefUROXime (CEFTIN) 500 MG tablet Take 1 tablet (500 mg total) by mouth 2 (two) times daily with a meal. 20 tablet 0   No facility-administered medications prior to visit.     EXAM:  BP 134/90 mmHg  Temp(Src) 97.8 F (36.6 C) (Oral)  Wt 297 lb 9.6 oz (134.99 kg)  Body mass index is 37.71 kg/(m^2).  GENERAL: vitals reviewed and listed above, alert, oriented, appears well hydrated and in no acute distress HEENT: atraumatic, conjunctiva  clear, no obvious abnormalities on inspection of external nose and ears NECK: no obvious masses on inspection palpation  No lymphadenopathy  CV: HRRR, no clubbing cyanosis or  peripheral edema nl cap refill  Abdomen:  Sof,t normal bowel sounds without hepatosplenomegaly, no guarding rebound or masses no CVA tenderness MS: moves all extremities without noticeable focal  abnormality PSYCH: pleasant and cooperative, no obvious depression or anxiety    ASSESSMENT AND PLAN:  Discussed the following assessment and plan:  Discoloration of skin of lower leg  Heavy alcohol use Curious looks like a feeding bruise in the subcutaneous area with a  central [place] spots but no bite mark. Slightly more indurated they are but not deep or fluctuant there is no tenderness no streaking no evidence of vascular compromise. The skin is intact. It is reminiscent of a panniculitis type of picture with cold but perhaps could been heat using the forklift with his leg against the hot equipment. But he has no pain or burning. Discussed options no evidence of blood disease medicine on a megaly or adenopathy declined blood work today. We'll monitor this avoid trauma and see how and when it resolves. If getting worse get back with [place]. Picture taken. Discussed  heavy alcohol use and and his risk with family history of alcoholism. he is cutting back but not a lot recently although 2 nights ago had 5 or 6 beverages. No recent aspirin. -Patient advised to return or notify health care team  if symptoms worsen ,persist or new concerns arise.  Patient Instructions   No evidence of infection curious area wonder if it could be a thermal change. Could be a bite but there is no entry area nor tenderness or itching.   Agree we can watch this with close observation and follow-up if progressing or alarm symptoms in any way. Or not resolving  Avoid any extremes and pressure or temperature at this time and observe. Agree with decreasing heavy alcohol use because of your family history and health risk.     Neta Mends. Marland Reine M.D.

## 2016-05-15 ENCOUNTER — Telehealth: Payer: Self-pay | Admitting: Adult Health

## 2016-05-15 ENCOUNTER — Encounter: Payer: Self-pay | Admitting: Adult Health

## 2016-05-15 ENCOUNTER — Ambulatory Visit (INDEPENDENT_AMBULATORY_CARE_PROVIDER_SITE_OTHER): Payer: Managed Care, Other (non HMO) | Admitting: Adult Health

## 2016-05-15 VITALS — BP 120/80 | Temp 98.6°F | Ht 74.5 in | Wt 294.7 lb

## 2016-05-15 DIAGNOSIS — M26602 Left temporomandibular joint disorder, unspecified: Secondary | ICD-10-CM

## 2016-05-15 DIAGNOSIS — S0300XA Dislocation of jaw, unspecified side, initial encounter: Secondary | ICD-10-CM

## 2016-05-15 MED ORDER — NAPROXEN 500 MG PO TABS: 500.0000 mg | ORAL_TABLET | Freq: Two times a day (BID) | ORAL | 0 refills | Status: DC

## 2016-05-15 MED ORDER — METHYLPREDNISOLONE 4 MG PO TBPK: ORAL_TABLET | ORAL | 0 refills | Status: DC

## 2016-05-15 MED ORDER — CYCLOBENZAPRINE HCL 10 MG PO TABS: 10.0000 mg | ORAL_TABLET | Freq: Three times a day (TID) | ORAL | 0 refills | Status: DC | PRN

## 2016-05-15 NOTE — Progress Notes (Signed)
Subjective:    Patient ID: Darius ByarsJarrod A Haliburton, male    DOB: 08/18/1981, 35 y.o.   MRN: [id number]  HPI  35 year old male who presents to the office today for one week of left sided jaw pain. The pain is described as a " sharp shooting" pain. The pain radiates into the left ear. Pain worse with opening jaw wide, chewing, and horizontal movements of the jaw. He does report hearing a clicking sound on occasion when he opens his jaw wide.   He is unsure of he grinds his teeth at night. Denies any trauma to his jaw.   Review of Systems  Constitutional: Negative.   HENT: Positive for ear pain. Negative for ear discharge, facial swelling, hearing loss and tinnitus.   Eyes: Negative.   Respiratory: Negative.   Cardiovascular: Negative.   All other systems reviewed and are negative.  Past Medical History:  Diagnosis Date  . Panic attack     Social History   Social History  . Marital status: Single    Spouse name: N/A  . Number of children: N/A  . Years of education: N/A   Occupational History  . Not on file.   Social History Main Topics  . Smoking status: Former Games developermoker  . Smokeless tobacco: Not on file  . Alcohol use Yes  . Drug use:   . Sexual activity: Not on file   Other Topics Concern  . Not on file   Social History Narrative  . No narrative on file    Past Surgical History:  Procedure Laterality Date  . HERNIA REPAIR    . TONSILLECTOMY    . TYMPANOSTOMY TUBE PLACEMENT      Family History  Problem Relation Age of Onset  . Alcohol abuse Father   . Cancer Sister     melanoma- scalp    Allergies  Allergen Reactions  . Penicillins     Unknown reaction    No current outpatient prescriptions on file prior to visit.   No current facility-administered medications on file prior to visit.     BP 120/80   Temp 98.6 F (37 C) (Oral)   Ht 6' 2.5" (1.892 m)   Wt 294 lb 11.2 oz (133.7 kg)   BMI 37.33 kg/m       Objective:   Physical Exam  Constitutional:  He is oriented to person, place, and time. He appears well-developed and well-nourished. No distress.  HENT:  Head: Normocephalic and atraumatic.  Right Ear: External ear normal.  Left Ear: External ear normal.  Nose: Nose normal.  Mouth/Throat: Uvula is midline and oropharynx is clear and moist. No oral lesions. No dental abscesses, lacerations or dental caries. No oropharyngeal exudate.  Popping sensation felt when opening jaw wide. Has limited ROM with jaw due to pain. No trauma noted.   Cardiovascular: Normal rate, regular rhythm, normal heart sounds and intact distal pulses.  Exam reveals no gallop.   No murmur heard. Pulmonary/Chest: Effort normal. No respiratory distress. He has no wheezes. He has no rales. He exhibits no tenderness.  Neurological: He is alert and oriented to person, place, and time.  Skin: Skin is warm and dry. No rash noted. He is not diaphoretic. No erythema. No pallor.  Psychiatric: He has a normal mood and affect. His behavior is normal. Judgment and thought content normal.  Nursing note and vitals reviewed.     Assessment & Plan:  1. TMJ (dislocation of temporomandibular joint), initial encounter - cyclobenzaprine (  FLEXERIL) 10 MG tablet; Take 1 tablet (10 mg total) by mouth 3 (three) times daily as needed for muscle spasms.  Dispense: 30 tablet; Refill: 0 - methylPREDNISolone (MEDROL DOSEPAK) 4 MG TBPK tablet; Take as directed  Dispense: 21 tablet; Refill: 0 - naproxen (NAPROSYN) 500 MG tablet; Take 1 tablet (500 mg total) by mouth 2 (two) times daily with a meal.  Dispense: 30 tablet; Refill: 0 - Follow up if no improvement  Shirline Freesory Prince Olivier, NP

## 2016-05-15 NOTE — Patient Instructions (Addendum)
It was great meeting you today!  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   I have sent in a prescription for Flexeril ( muscle relaxer), Prednisone, and Naproxyn ( anti inflammatory). Take these as directed  Follow up if no improvement  Temporomandibular Joint Syndrome Temporomandibular joint (TMJ) syndrome is a condition that affects the joints between your jaw and your skull. The TMJs are located near your ears and allow your jaw to open and close. These joints and the nearby muscles are involved in all movements of the jaw. People with TMJ syndrome have pain in the area of these joints and muscles. Chewing, biting, or other movements of the jaw can be difficult or painful. TMJ syndrome can be caused by various things. In many cases, the condition is mild and goes away within a few weeks. For some people, the condition can become a long-term problem. CAUSES Possible causes of TMJ syndrome include:  Grinding your teeth or clenching your jaw. Some people do this when they are under stress.  Arthritis.  Injury to the jaw.  Head or neck injury.  Teeth or dentures that are not aligned well. In some cases, the cause of TMJ syndrome may not be known. SIGNS AND SYMPTOMS The most common symptom is an aching pain on the side of the head in the area of the TMJ. Other symptoms may include:  Pain when moving your jaw, such as when chewing or biting.  Being unable to open your jaw all the way.  Making a clicking sound when you open your mouth.  Headache.  Earache.  Neck or shoulder pain. DIAGNOSIS Diagnosis can usually be made based on your symptoms, your medical history, and a physical exam. Your health care provider may check the range of motion of your jaw. Imaging tests, such as X-rays or an MRI, are sometimes done. You may need to see your dentist to determine if your teeth and jaw are lined up correctly. TREATMENT TMJ syndrome often goes away on its own. If treatment is needed, the  options may include:  Eating soft foods and applying ice or heat.  Medicines to relieve pain or inflammation.  Medicines to relax the muscles.  A splint, bite plate, or mouthpiece to prevent teeth grinding or jaw clenching.  Relaxation techniques or counseling to help reduce stress.  Transcutaneous electrical nerve stimulation (TENS). This helps to relieve pain by applying an electrical current through the skin.  Acupuncture. This is sometimes helpful to relieve pain.  Jaw surgery. This is rarely needed. HOME CARE INSTRUCTIONS  Take medicines only as directed by your health care provider.  Eat a soft diet if you are having trouble chewing.  Apply ice to the painful area.  Put ice in a plastic bag.  Place a towel between your skin and the bag.  Leave the ice on for 20 minutes, 2-3 times a day.  Apply a warm compress to the painful area as directed.  Massage your jaw area and perform any jaw stretching exercises as recommended by your health care provider.  If you were given a mouthpiece or bite plate, wear it as directed.  Avoid foods that require a lot of chewing. Do not chew gum.  Keep all follow-up visits as directed by your health care provider. This is important. SEEK MEDICAL CARE IF:  You are having trouble eating.  You have new or worsening symptoms. SEEK IMMEDIATE MEDICAL CARE IF:  Your jaw locks open or closed.   This information is not  intended to replace advice given to you by your health care provider. Make sure you discuss any questions you have with your health care provider.   Document Released: 06/09/2001 Document Revised: 10/05/2014 Document Reviewed: 04/19/2014 Elsevier Interactive Patient Education [employer]2016 Elsevier Inc.

## 2016-05-15 NOTE — Telephone Encounter (Signed)
Entered in error

## 2016-12-16 ENCOUNTER — Telehealth: Payer: Self-pay | Admitting: Family Medicine

## 2016-12-16 MED ORDER — OSELTAMIVIR PHOSPHATE 75 MG PO CAPS: 75.0000 mg | ORAL_CAPSULE | Freq: Every day | ORAL | 0 refills | Status: DC

## 2016-12-16 NOTE — Telephone Encounter (Signed)
Pt notified to pick up rx at the pharmacy. 

## 2016-12-16 NOTE — Telephone Encounter (Signed)
Pt would like to have Tamiflu due to his son was Dx w/the flu  Pharm:  [facility] [address].   Pt would like to have a call when called in pls.

## 2016-12-16 NOTE — Telephone Encounter (Signed)
Tamiflu 75 mg daily x 7 days  

## 2017-01-08 ENCOUNTER — Encounter: Payer: Self-pay | Admitting: Family Medicine

## 2017-01-08 ENCOUNTER — Ambulatory Visit (INDEPENDENT_AMBULATORY_CARE_PROVIDER_SITE_OTHER): Payer: Managed Care, Other (non HMO) | Admitting: Family Medicine

## 2017-01-08 VITALS — BP 120/84 | HR 93 | Temp 98.8°F | Wt 309.9 lb

## 2017-01-08 DIAGNOSIS — J019 Acute sinusitis, unspecified: Secondary | ICD-10-CM | POA: Diagnosis not present

## 2017-01-08 MED ORDER — HYDROCODONE-HOMATROPINE 5-1.5 MG/5ML PO SYRP: 5.0000 mL | ORAL_SOLUTION | Freq: Four times a day (QID) | ORAL | 0 refills | Status: AC | PRN

## 2017-01-08 MED ORDER — DOXYCYCLINE HYCLATE 100 MG PO CAPS: 100.0000 mg | ORAL_CAPSULE | Freq: Two times a day (BID) | ORAL | 0 refills | Status: DC

## 2017-01-08 NOTE — Progress Notes (Signed)
Pre visit review using our clinic review tool, if applicable. No additional management support is needed unless otherwise documented below in the visit note. 

## 2017-01-08 NOTE — Progress Notes (Signed)
Subjective:     Patient ID: Darius Stone, male   DOB: 07-Sep-1981, 36 y.o.   MRN: [id number]  HPI Patient seen with onset earlier this week of sinus congestion, headache, productive cough. He has had some thick yellow to green nasal discharge and also productive cough. Cough has been severe at night. Took NyQuil without relief. Last night he only got a few hours sleep secondary to severe cough. Quit smoking several years ago.  He's noted some bilateral maxillary facial pain and upper teeth pain. Took some Sudafed with minimal relief.  Past Medical History:  Diagnosis Date  . Panic attack    Past Surgical History:  Procedure Laterality Date  . HERNIA REPAIR    . TONSILLECTOMY    . TYMPANOSTOMY TUBE PLACEMENT      reports that he has quit smoking. He has never used smokeless tobacco. He reports that he drinks alcohol. He reports that he uses drugs. family history includes Alcohol abuse in his father; Cancer in his sister. Allergies  Allergen Reactions  . Penicillins     Unknown reaction     Review of Systems  Constitutional: Positive for fatigue. Negative for chills and fever.  HENT: Positive for sinus pain and sinus pressure.   Respiratory: Positive for cough.   Neurological: Positive for headaches.       Objective:   Physical Exam  Constitutional: He appears well-developed and well-nourished.  HENT:  Right Ear: External ear normal.  Left Ear: External ear normal.  Mouth/Throat: Oropharynx is clear and moist.  Neck: Neck supple.  Cardiovascular: Normal rate and regular rhythm.   Pulmonary/Chest: Effort normal and breath sounds normal. No respiratory distress. He has no wheezes. He has no rales.  Lymphadenopathy:    He has no cervical adenopathy.       Assessment:     Acute sinusitis with cough.    Plan:     -We explained that most sinusitis is probably viral -Hycodan cough syrup 1 teaspoon daily at bedtime for severe cough -Prescription for doxycycline 100 mg  twice daily for 10 days if symptoms not improving over the next several days  Kristian Covey MD [facility]

## 2017-02-09 ENCOUNTER — Ambulatory Visit (INDEPENDENT_AMBULATORY_CARE_PROVIDER_SITE_OTHER): Payer: Managed Care, Other (non HMO) | Admitting: Family Medicine

## 2017-02-09 ENCOUNTER — Encounter: Payer: Self-pay | Admitting: Family Medicine

## 2017-02-09 VITALS — BP 138/92 | Temp 98.3°F | Wt 311.0 lb

## 2017-02-09 DIAGNOSIS — J3089 Other allergic rhinitis: Secondary | ICD-10-CM

## 2017-02-09 MED ORDER — MONTELUKAST SODIUM 10 MG PO TABS: 10.0000 mg | ORAL_TABLET | Freq: Every day | ORAL | 3 refills | Status: DC

## 2017-02-09 MED ORDER — PREDNISONE 20 MG PO TABS: ORAL_TABLET | ORAL | 1 refills | Status: DC

## 2017-02-09 NOTE — Progress Notes (Signed)
Darius Stone is a 36 year old nonsmoking male who comes in today for evaluation of allergic rhinitis for a month  He has allergic rhinitis every spring. He had a flare last year now flare this years. He's tried Tylenol and Advil etc. over-the-counter steroid nasal right no avail  No wheezing  BP (!) 138/92 (BP Location: Right Arm)   Temp 98.3 F (36.8 C) (Oral)   Wt (!) 311 lb (141.1 kg)   BMI 39.40 kg/m  Examination HEENT were negative except for marked bilateral nasal edema septum in the midline neck was supple no adenopathy lungs are clear  #1 allergic rhinitis.......Marland Kitchen. prednisone burst and taper.......Marland Kitchen. Zyrtec and Singulair daily at bedtime

## 2017-02-09 NOTE — Patient Instructions (Signed)
Prednisone 20 mg........ 2 tabs 3 days or until you feel a lot better........ then taper as outlined............ one for 3, I have to 3, then a half a tab Monday Wednesday Friday for a two-week taper  Zyrtec 10 mg plain.......Marland Kitchen. 1 at bedtime  Singulair 10 mg.......Marland Kitchen. 1 at bedtime  At the beginning of next years pollen season begin the Zyrtec and the Singulair about 2 weeks prior to when you usually have symptoms. If you do this at a time he typically will not have this severe reaction.

## 2017-02-15 ENCOUNTER — Telehealth: Payer: Self-pay | Admitting: Family Medicine

## 2017-02-15 NOTE — Telephone Encounter (Signed)
° ° °  Pt said Dr Tawanna Coolerodd wrote on his paper work different instructions than what was on the bottle of the below med. Pt said his wife threw the bottle away and is not sure what he should be taking to ween off the med ..Marland Kitchen

## 2017-02-15 NOTE — Telephone Encounter (Signed)
Spoke to the pt.  His wife has thrown away the directions to the prednisone.  I have given the pt the directions as directed by Dr. Tawanna Coolerodd.  Pt stated he understands and will continue the taper as directed.  predniSONE (DELTASONE) 20 MG tablet 40 tablet 1 02/09/2017    Sig: 2 tabs x 3 days, 1 tab x 3 days, 1/2 tab x 3 days, 1/2 tab M,W,F x 2 weeks

## 2017-02-17 ENCOUNTER — Other Ambulatory Visit: Payer: Self-pay | Admitting: Adult Health

## 2017-02-17 DIAGNOSIS — S0300XA Dislocation of jaw, unspecified side, initial encounter: Secondary | ICD-10-CM

## 2017-02-18 NOTE — Telephone Encounter (Signed)
Per Dr. Tawanna Coolerodd, pt should now buy OTC.  Medication denied and a message sent to the pharmacy.

## 2017-06-30 ENCOUNTER — Ambulatory Visit (INDEPENDENT_AMBULATORY_CARE_PROVIDER_SITE_OTHER): Payer: Managed Care, Other (non HMO) | Admitting: Family Medicine

## 2017-06-30 ENCOUNTER — Encounter: Payer: Self-pay | Admitting: Family Medicine

## 2017-06-30 VITALS — BP 130/88 | Temp 98.4°F | Ht 74.5 in | Wt 307.0 lb

## 2017-06-30 DIAGNOSIS — H6121 Impacted cerumen, right ear: Secondary | ICD-10-CM | POA: Diagnosis not present

## 2017-06-30 NOTE — Progress Notes (Signed)
   Subjective:    Patient ID: [name], male    DOB: 11/21/1980, 36 y.o.   MRN: [id number]  HPI Here for the onset of some mild pain and muffled hearing in the right ear last night. This is about the same this morning. The left ear is fine. No dizziness or ringing.    Review of Systems  Constitutional: Negative.   HENT: Positive for ear pain and hearing loss. Negative for congestion, ear discharge, postnasal drip, sinus pain, sinus pressure and sore throat.   Eyes: Negative.   Respiratory: Negative.        Objective:   Physical Exam  Constitutional: He appears well-developed and well-nourished.  HENT:  Left Ear: External ear normal.  Nose: Nose normal.  Mouth/Throat: Oropharynx is clear and moist.  Right ear canal is blocked with cerumen   Eyes: Conjunctivae are normal.  Neck: Neck supple. No thyromegaly present.  Pulmonary/Chest: Effort normal and breath sounds normal. No respiratory distress. He has no wheezes. He has no rales.  Lymphadenopathy:    He has no cervical adenopathy.          Assessment & Plan:  Cerumen impaction. This was irrigated clear with water. Recheck prn. Gershon Crane, MD

## 2017-06-30 NOTE — Patient Instructions (Signed)
WE NOW OFFER    [facility] FAST TRACK!!!  SAME DAY Appointments for ACUTE CARE  Such as: Sprains, Injuries, cuts, abrasions, rashes, muscle pain, joint pain, back pain Colds, flu, sore throats, headache, allergies, cough, fever  Ear pain, sinus and eye infections Abdominal pain, nausea, vomiting, diarrhea, upset stomach Animal/insect bites  3 Easy Ways to Schedule: Walk-In Scheduling Call in scheduling Mychart Sign-up: [email or web address]         

## 2017-07-15 ENCOUNTER — Encounter: Payer: Self-pay | Admitting: Family Medicine

## 2017-07-15 ENCOUNTER — Ambulatory Visit (INDEPENDENT_AMBULATORY_CARE_PROVIDER_SITE_OTHER): Payer: Managed Care, Other (non HMO) | Admitting: Family Medicine

## 2017-07-15 VITALS — Temp 98.8°F | Ht 74.5 in | Wt 289.0 lb

## 2017-07-15 DIAGNOSIS — R1031 Right lower quadrant pain: Secondary | ICD-10-CM | POA: Diagnosis not present

## 2017-07-15 NOTE — Patient Instructions (Signed)
WE NOW OFFER   [facility] FAST TRACK!!!  SAME DAY Appointments for ACUTE CARE  Such as: Sprains, Injuries, cuts, abrasions, rashes, muscle pain, joint pain, back pain Colds, flu, sore throats, headache, allergies, cough, fever  Ear pain, sinus and eye infections Abdominal pain, nausea, vomiting, diarrhea, upset stomach Animal/insect bites  3 Easy Ways to Schedule: Walk-In Scheduling Call in scheduling Mychart Sign-up: [email or web address]         

## 2017-07-15 NOTE — Progress Notes (Signed)
   Subjective:    Patient ID: Darius Stone, male    DOB: 09/14/1981, 36 y.o.   MRN: [id number]  HPI Here for 2 and 1/2 weeks of pain in the right groin area. He has not been exercising. No recent trauma. No trouble urinating or penile DC. He thinks he may have a hernia. He notes that he had bilateral inguinal hernia repairs as a young child.    Review of Systems  Constitutional: Negative.   Respiratory: Negative.   Cardiovascular: Negative.   Gastrointestinal: Positive for abdominal pain. Negative for abdominal distention, anal bleeding, blood in stool, constipation, diarrhea, nausea, rectal pain and vomiting.  Genitourinary: Negative.        Objective:   Physical Exam  Constitutional: He appears well-developed and well-nourished.  Cardiovascular: Normal rate, regular rhythm, normal heart sounds and intact distal pulses.   Pulmonary/Chest: Effort normal and breath sounds normal. No respiratory distress. He has no wheezes. He has no rales.  Abdominal: Soft. Bowel sounds are normal. He exhibits no distension and no mass. There is no rebound and no guarding.  He is tender in the right inguinal area, no masses or hernias felt   Genitourinary:  Genitourinary Comments: Both testicles and both epididymi are normal with no tenderness           Assessment & Plan:  Right groin pain, possible early hernia. Refer to Surgery to evaluate.  Gershon CraneStephen Dilynn Munroe, MD

## 2017-10-13 ENCOUNTER — Encounter: Payer: Self-pay | Admitting: Family Medicine

## 2017-10-13 ENCOUNTER — Ambulatory Visit: Payer: Managed Care, Other (non HMO) | Admitting: Family Medicine

## 2017-10-13 VITALS — BP 100/78 | HR 88 | Temp 98.0°F | Wt 236.0 lb

## 2017-10-13 DIAGNOSIS — B9789 Other viral agents as the cause of diseases classified elsewhere: Secondary | ICD-10-CM | POA: Diagnosis not present

## 2017-10-13 DIAGNOSIS — J069 Acute upper respiratory infection, unspecified: Secondary | ICD-10-CM

## 2017-10-13 DIAGNOSIS — J309 Allergic rhinitis, unspecified: Secondary | ICD-10-CM | POA: Insufficient documentation

## 2017-10-13 NOTE — Progress Notes (Signed)
Darius Stone is a 37 year old married male nonsmoker who comes in today for evaluation of a viral infection  Last Thursday he said he didn't feel good with head congestion and a slight headache. On Saturday developed a sore throat. Saturday evening had some chills and couldn't get warm. On Sunday he went to an urgent care. Examination was negative but for some reason he was given a shot of steroids and started on clindamycin although a strep test was negative  Today comes in for follow-up he feels well except for a little soreness in the right tonsillar area  BP 100/78 (BP Location: Left Arm, Patient Position: Sitting, Cuff Size: Normal)   Pulse 88   Temp 98 F (36.7 C) (Oral)   Wt 236 lb (107 kg)   BMI 29.90 kg/m  Well-developed well-nourished male no acute distress HEENT were negative no erythema no abscess no adenopathy  Impression viral syndrome............ stop the clindamycin..... Treat the viral infection symptomatically..Marland Kitchen

## 2017-10-13 NOTE — Patient Instructions (Signed)
Stop the clindamycin  Drink lots of liquids........ 2 Tylenol 3 times a day when necessary........ Chloraseptic lozingers........ Hydromet 1/2-1 teaspoon at bedtime when necessary for nighttime cough  Return when necessary

## 2018-02-18 ENCOUNTER — Ambulatory Visit: Payer: Managed Care, Other (non HMO) | Admitting: Adult Health

## 2018-02-18 ENCOUNTER — Encounter: Payer: Self-pay | Admitting: Adult Health

## 2018-02-18 VITALS — BP 110/70 | Temp 97.5°F | Wt 209.0 lb

## 2018-02-18 DIAGNOSIS — T148XXA Other injury of unspecified body region, initial encounter: Secondary | ICD-10-CM | POA: Diagnosis not present

## 2018-02-18 DIAGNOSIS — J302 Other seasonal allergic rhinitis: Secondary | ICD-10-CM | POA: Diagnosis not present

## 2018-02-18 MED ORDER — METHYLPREDNISOLONE 4 MG PO TBPK: ORAL_TABLET | ORAL | 0 refills | Status: DC

## 2018-02-18 MED ORDER — MONTELUKAST SODIUM 10 MG PO TABS: 10.0000 mg | ORAL_TABLET | Freq: Every day | ORAL | 3 refills | Status: DC

## 2018-02-18 MED ORDER — HYDROCODONE-ACETAMINOPHEN 10-325 MG PO TABS: 1.0000 | ORAL_TABLET | Freq: Four times a day (QID) | ORAL | 0 refills | Status: AC | PRN

## 2018-02-18 MED ORDER — CYCLOBENZAPRINE HCL 10 MG PO TABS: 10.0000 mg | ORAL_TABLET | Freq: Three times a day (TID) | ORAL | 0 refills | Status: DC | PRN

## 2018-02-18 NOTE — Progress Notes (Signed)
Subjective:    Patient ID: Darius Stone, male    DOB: 1981-05-30, 37 y.o.   MRN: [id number]  HPI 37 year old male who  has a past medical history of Panic attack. He is a patient of Dr. Tawanna Cooler who I am seeing today for an acute issue. Of muscle strain in left groin and thigh. Reports was at work yesterday when he was coming down the stairs when he felt a pain in his left groin and thigh that is described as " sharp". He continues to work for 10 hours.  Reports using Motrin for pain relief with no improvement. Took a warm bath last night which also did not improve symptoms.   Today when he woke up he felt "stiff"   Pain is worse with certain movements and walking.   Denies feeling a " popping sensation". He has not noticed any bruising.    Review of Systems See HPI   Past Medical History:  Diagnosis Date  . Panic attack     Social History   Socioeconomic History  . Marital status: Single    Spouse name: Not on file  . Number of children: Not on file  . Years of education: Not on file  . Highest education level: Not on file  Occupational History  . Not on file  Social Needs  . Financial resource strain: Not on file  . Food insecurity:    Worry: Not on file    Inability: Not on file  . Transportation needs:    Medical: Not on file    Non-medical: Not on file  Tobacco Use  . Smoking status: Former Games developer  . Smokeless tobacco: Never Used  Substance and Sexual Activity  . Alcohol use: Yes  . Drug use: Yes  . Sexual activity: Not on file  Lifestyle  . Physical activity:    Days per week: Not on file    Minutes per session: Not on file  . Stress: Not on file  Relationships  . Social connections:    Talks on phone: Not on file    Gets together: Not on file    Attends religious service: Not on file    Active member of club or organization: Not on file    Attends meetings of clubs or organizations: Not on file    Relationship status: Not on file  . Intimate partner  violence:    Fear of current or ex partner: Not on file    Emotionally abused: Not on file    Physically abused: Not on file    Forced sexual activity: Not on file  Other Topics Concern  . Not on file  Social History Narrative  . Not on file    Past Surgical History:  Procedure Laterality Date  . HERNIA REPAIR    . TONSILLECTOMY    . TYMPANOSTOMY TUBE PLACEMENT      Family History  Problem Relation Age of Onset  . Alcohol abuse Father   . Cancer Sister        melanoma- scalp    Allergies  Allergen Reactions  . Penicillins     Unknown reaction    Current Outpatient Medications on File Prior to Visit  Medication Sig Dispense Refill  . fluticasone (FLONASE) 50 MCG/ACT nasal spray Place into both nostrils daily.     No current facility-administered medications on file prior to visit.     BP 110/70   Temp (!) 97.5 F (36.4 C) (Oral)  Wt 209 lb (94.8 kg)   BMI 26.48 kg/m       Objective:   Physical Exam  Constitutional: He is oriented to person, place, and time. He appears well-developed and well-nourished. No distress.  Musculoskeletal: He exhibits tenderness (left groin and upper thigh ). He exhibits no edema or deformity.  Discomfort in left thigh and groin with knee to chest and external rotation. No decrease in ROM   Neurological: He is alert and oriented to person, place, and time.  Skin: Skin is warm. No rash noted. He is not diaphoretic. No erythema. No pallor.  No bruising noted    Nursing note and vitals reviewed.     Assessment & Plan:  1. Muscle strain - Appears as muscle strain. Advised rest over the weekend. Continue with heating pad. Norco for breakthrough pain.  - Follow up if no improvement in the next 2-3 days  - cyclobenzaprine (FLEXERIL) 10 MG tablet; Take 1 tablet (10 mg total) by mouth 3 (three) times daily as needed for muscle spasms.  Dispense: 30 tablet; Refill: 0 - HYDROcodone-acetaminophen (NORCO) 10-325 MG tablet; Take 1 tablet by  mouth every 6 (six) hours as needed for up to 3 days.  Dispense: 10 tablet; Refill: 0 - methylPREDNISolone (MEDROL DOSEPAK) 4 MG TBPK tablet; Take as directed  Dispense: 21 tablet; Refill: 0  2. Seasonal allergies  - montelukast (SINGULAIR) 10 MG tablet; Take 1 tablet (10 mg total) by mouth at bedtime.  Dispense: 90 tablet; Refill: 3   Shirline Frees, NP

## 2018-05-27 ENCOUNTER — Encounter: Payer: Self-pay | Admitting: Family Medicine

## 2018-05-27 ENCOUNTER — Ambulatory Visit: Payer: Managed Care, Other (non HMO) | Admitting: Family Medicine

## 2018-05-27 VITALS — BP 110/72 | HR 78 | Temp 97.9°F | Ht 74.5 in | Wt 242.4 lb

## 2018-05-27 DIAGNOSIS — S76212D Strain of adductor muscle, fascia and tendon of left thigh, subsequent encounter: Secondary | ICD-10-CM

## 2018-05-27 MED ORDER — MELOXICAM 15 MG PO TABS: 15.0000 mg | ORAL_TABLET | Freq: Every day | ORAL | 2 refills | Status: DC

## 2018-05-27 NOTE — Progress Notes (Signed)
   Subjective:    Patient ID: Darius Stone, male    DOB: 12/15/1980, 37 y.o.   MRN: [id number]  HPI Here for left groin pain that started in May and has never gotten better. He was seen here once and was given a Medrol dose pack and Norco. This helped for about a week but the pain returned. It waxes and wanes but never stops. Some days he can hardly walk due to pain. He takes Ibuprofen.    Review of Systems  Constitutional: Negative.   Respiratory: Negative.   Cardiovascular: Negative.   Musculoskeletal: Positive for myalgias.       Objective:   Physical Exam  Constitutional: He appears well-developed and well-nourished.  Cardiovascular: Normal rate, regular rhythm, normal heart sounds and intact distal pulses.  Pulmonary/Chest: Effort normal and breath sounds normal.  Musculoskeletal:  He is very tender at the pelvic insertion of the left hip adductor. Full ROM           Assessment & Plan:  Chronic groin strain. Try Meloxicam daily. Refer to Sports Medicine.  Gershon CraneStephen Odaly Peri, MD

## 2018-06-06 ENCOUNTER — Ambulatory Visit: Payer: Managed Care, Other (non HMO) | Admitting: Sports Medicine

## 2018-06-07 ENCOUNTER — Ambulatory Visit: Payer: Managed Care, Other (non HMO) | Admitting: Sports Medicine

## 2018-06-07 ENCOUNTER — Encounter: Payer: Self-pay | Admitting: Sports Medicine

## 2018-06-07 ENCOUNTER — Ambulatory Visit
Admission: RE | Admit: 2018-06-07 | Discharge: 2018-06-07 | Disposition: A | Discharge status: Home / Self Care | Payer: Managed Care, Other (non HMO) | Source: Ambulatory Visit | Attending: Sports Medicine | Admitting: Sports Medicine

## 2018-06-07 VITALS — BP 130/80 | Ht 75.0 in | Wt 240.0 lb

## 2018-06-07 DIAGNOSIS — M25552 Pain in left hip: Secondary | ICD-10-CM

## 2018-06-08 ENCOUNTER — Telehealth: Payer: Self-pay | Admitting: Sports Medicine

## 2018-06-08 ENCOUNTER — Encounter: Payer: Self-pay | Admitting: Sports Medicine

## 2018-06-08 NOTE — Progress Notes (Signed)
   Subjective:    Patient ID: Darius Stone, male    DOB: April 10, 1981, 37 y.o.   MRN: [id number]  HPI chief complaint: Left hip pain  37 year old male comes in today at the request of his PCP for evaluation of left hip pain. He noticed an acute onset of pain in May while walking down some stairs. At the time, his pain was mild but the following day he had significant discomfort and a limp. He was then seen by his primary care physician as he was concerned about a hernia. He has had two prior hernia surgeries, both when he was a young child. His PCP ruled out hernia and placed him on prednisone, muscle relaxer, and Vicodin. Vicodin and muscle relaxer were not helpful but the prednisone helped short-term. Since that time he is had intermittent pain that he localizes to the left groin. He's been told that he may have a muscle strain. He used to be an avid runner and lost quite a bit of weight by running but recently had to stopped running due to his pain. His pain will radiate down the thigh on occasion. He denies any pain in the posterior aspect of his hip. He denies numbness or tingling. He does have some difficulty sleeping at night. No prior hip surgeries. No low back pain.  Past medical history is reviewed Medications are reviewed Allergies are reviewed    Review of Systems    as above Objective:   Physical Exam  Well-developed, well-nourished. No acute distress. Awake alert and oriented 3. Vital signs reviewed  Left hip: smooth painless hip range of motion with a negative log roll. No tenderness to palpation. He has reproducible pain with resisted hip abduction. Positive FADIR. Negative FABER. Negative straight leg raise. Neurovascularly intact distally. Walking without a noticeable limp.  X-rays of the left hip including AP and lateral view are reviewed. There is a lucency in the femoral head best seen on the lateral view which may suggest early AVN. No fracture is seen.        Assessment & Plan:   Left hip pain-rule out AVN versus labral tear  Patient's x-rays suggest possible AVN.We need to get an MRI to evaluate further. I will get an MR arthrogram to rule out labral tear as well. Phone follow-up with those results when available and we will delineate further treatment based on those findings.

## 2018-06-08 NOTE — Telephone Encounter (Signed)
.  .  .        xx

## 2018-06-27 ENCOUNTER — Ambulatory Visit
Admission: RE | Admit: 2018-06-27 | Discharge: 2018-06-27 | Disposition: A | Discharge status: Home / Self Care | Payer: Managed Care, Other (non HMO) | Source: Ambulatory Visit | Attending: Sports Medicine | Admitting: Sports Medicine

## 2018-06-27 DIAGNOSIS — M25552 Pain in left hip: Secondary | ICD-10-CM

## 2018-06-27 MED ORDER — IOPAMIDOL (ISOVUE-M 200) INJECTION 41%
11.0000 mL | Freq: Once | INTRAMUSCULAR | Status: AC
  Administered 2018-06-27: 11 mL via INTRA_ARTICULAR

## 2018-06-28 ENCOUNTER — Telehealth: Payer: Self-pay | Admitting: Family Medicine

## 2018-06-28 NOTE — Telephone Encounter (Signed)
Copied from CRM [id number]. Topic: Quick Communication - See Telephone Encounter >> Jun 28, 2018  1:13 PM Arlyss Gandy, NT wrote: CRM for notification. See Telephone encounter for: 06/28/18. Pt would like to see if a pain medication can be called into his pharmacy other than the meloxicam? He states the meloxicam is not working for him. [facility] [phone] [facility], [place] - [address] [phone] (Phone) [phone] (Fax)

## 2018-06-29 ENCOUNTER — Telehealth: Payer: Self-pay | Admitting: Sports Medicine

## 2018-06-29 ENCOUNTER — Other Ambulatory Visit: Payer: Self-pay

## 2018-06-29 ENCOUNTER — Telehealth: Payer: Self-pay | Admitting: Family Medicine

## 2018-06-29 DIAGNOSIS — M25552 Pain in left hip: Secondary | ICD-10-CM

## 2018-06-29 NOTE — Telephone Encounter (Signed)
  I spoke with the patient on the phone yesterday after reviewing MRI arthrogram findings of his left hip.  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  No evidence of labral tear.  Patient's pain is severe enough that he would like to consider total hip arthroplasty despite his young age.  Therefore, I will refer the patient to Dr. Turner Daniels to discuss this further.  I will defer further work-up and treatment to the discretion of Dr. Turner Daniels and the patient will follow up with me as needed.

## 2018-06-29 NOTE — Telephone Encounter (Signed)
Dr. Fry please advise. Thanks  

## 2018-06-29 NOTE — Telephone Encounter (Signed)
Please advise 

## 2018-06-29 NOTE — Telephone Encounter (Signed)
Spoke to the pt.  He has an appt with an ortho surgeon tomorrow at 9:45.  He complains of pain worsening today in his left hip.  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  Says he can only take 3-4 steps at a time without significant pain.  Pt is taking 15 mg of Mobic daily but says it is not helping.  Did say that he had more pain alleviation with 800 mg of ibuprofen several times daily.  Would like to know if something else can be sent in or if he can go back to taking the ibuprofen.  If so, how often? Please advise.  Thanks!!!

## 2018-06-29 NOTE — Telephone Encounter (Signed)
Copied from CRM [id number]. Topic: Quick Communication - See Telephone Encounter >> Jun 28, 2018  1:13 PM Arlyss Gandy, NT wrote: CRM for notification. See Telephone encounter for: 06/28/18. Pt would like to see if a pain medication can be called into his pharmacy other than the meloxicam? He states the meloxicam is not working for him. [id number], [place] - [address] [phone] (Phone) [phone] (Fax) >> Jun 29, 2018  9:36 AM Gerrianne Scale wrote: Pt calling about pain medicine he states that he left work today due to the pain and he normally don't leave from work pt wants to know if he need to make an appt to be seen

## 2018-06-30 NOTE — Telephone Encounter (Signed)
I am sure he has seen Orthopedics by now

## 2018-06-30 NOTE — Telephone Encounter (Signed)
Tender he is seen by Dr. Margaretha Sheffield for evaluation.  He will need to call that physician for medication

## 2018-07-01 NOTE — Telephone Encounter (Signed)
Called pt to inform him to f/u with Dr.Draper for Rx refill. Pt mailbox is full.

## 2018-07-01 NOTE — Telephone Encounter (Signed)
Spoke to the pt.  He seen ortho yesterday morning.  Was given tramadol for his hip pain.  Wanted Dr. Clent Ridges to be aware that he should be receiving a medical clearance form for his upcoming surgery.  Informed him that I will notify Dr. Claris Che assistant to watch for the form.

## 2018-07-01 NOTE — Telephone Encounter (Signed)
Noted.  Will watch out for this form to come through and have Dr. Clent Ridges complete it.

## 2018-07-06 NOTE — Telephone Encounter (Signed)
Darius Stone from [facility] called in wanting to know did we receive the outpatient joint replacement program paperwork medical clearance. She says she faxed it over on 10/3 and just wanted to know if Dr. Clent Ridges received it.

## 2018-07-07 NOTE — Telephone Encounter (Signed)
Olegario Messier with [facility] is calling to f/u on surgical clearance paperwork. She faxed to [phone] on 07/01/18 and is refaxing now. She is requesting call back today to notify that it is received. They are not able to schedule pt for hip replacement surgery until they receive the paperwork. Kathy's direct line is ph # [phone]

## 2018-07-08 NOTE — Telephone Encounter (Signed)
Pt called to check status. Please advise.  °

## 2018-07-08 NOTE — Telephone Encounter (Signed)
Dr. Clent Ridges stated that this was for Dr. Tawanna Cooler to sign off on since he is the PCP.  Placed in Dr. Nelida Meuse sign folder and will forward to [facility] to follow up on form.

## 2018-07-08 NOTE — Telephone Encounter (Signed)
Returned call to [place] and asked her to re-fax form to my direct fax number as we still have not received it.  Notified patient that we will follow-up with him later today.

## 2018-07-11 NOTE — Telephone Encounter (Signed)
Paper work filled put by Dr.Todd. Placed in fax container.

## 2018-07-14 ENCOUNTER — Encounter (HOSPITAL_COMMUNITY): Payer: Self-pay | Admitting: *Deleted

## 2018-07-19 ENCOUNTER — Other Ambulatory Visit (HOSPITAL_COMMUNITY): Payer: Managed Care, Other (non HMO)

## 2018-07-22 ENCOUNTER — Inpatient Hospital Stay: Admit: 2018-07-22 | Payer: Managed Care, Other (non HMO) | Admitting: Orthopedic Surgery

## 2018-07-22 HISTORY — PX: TOTAL HIP ARTHROPLASTY: SHX124

## 2018-07-22 SURGERY — ARTHROPLASTY, HIP, TOTAL, ANTERIOR APPROACH
Anesthesia: Spinal | Laterality: Left

## 2018-08-10 ENCOUNTER — Other Ambulatory Visit: Payer: Self-pay | Admitting: Orthopedic Surgery

## 2018-08-10 DIAGNOSIS — M545 Low back pain, unspecified: Secondary | ICD-10-CM

## 2018-08-14 ENCOUNTER — Ambulatory Visit
Admission: RE | Admit: 2018-08-14 | Discharge: 2018-08-14 | Disposition: A | Discharge status: Home / Self Care | Payer: Managed Care, Other (non HMO) | Source: Ambulatory Visit | Attending: Orthopedic Surgery | Admitting: Orthopedic Surgery

## 2018-08-14 DIAGNOSIS — M545 Low back pain, unspecified: Secondary | ICD-10-CM

## 2018-10-12 ENCOUNTER — Ambulatory Visit: Payer: Managed Care, Other (non HMO) | Admitting: Adult Health

## 2018-10-12 ENCOUNTER — Encounter: Payer: Self-pay | Admitting: Family Medicine

## 2018-10-12 ENCOUNTER — Ambulatory Visit: Payer: Managed Care, Other (non HMO) | Admitting: Family Medicine

## 2018-10-12 VITALS — BP 124/82 | HR 74 | Temp 97.9°F | Wt 263.0 lb

## 2018-10-12 DIAGNOSIS — H6981 Other specified disorders of Eustachian tube, right ear: Secondary | ICD-10-CM

## 2018-10-12 NOTE — Progress Notes (Signed)
   Subjective:    Patient ID: Darius Stone, male    DOB: [birth year], 38 y.o.   MRN: [id number]  HPI Here for 6 days of a feeling of pressure in the right ear. No change in hearing, no pain or dizziness.    Review of Systems  Constitutional: Negative.   HENT: Negative for congestion, ear discharge, ear pain, facial swelling, hearing loss, postnasal drip, sinus pressure and sinus pain.   Eyes: Negative.   Respiratory: Negative.        Objective:   Physical Exam Constitutional:      Appearance: Normal appearance.  HENT:     Left Ear: Tympanic membrane and ear canal normal.     Ears:     Comments: Right TM has post surgical changes but no erythema or effusion. The right canal has a trace of cerumen on the edge but essentially the canal is clear     Nose: Nose normal.     Mouth/Throat:     Pharynx: Oropharynx is clear.  Eyes:     Conjunctiva/sclera: Conjunctivae normal.  Pulmonary:     Effort: Pulmonary effort is normal.     Breath sounds: Normal breath sounds.  Lymphadenopathy:     Cervical: No cervical adenopathy.  Neurological:     Mental Status: He is alert.           Assessment & Plan:  Eustachian tube dysfunction. Try Zyrtec D bid for a week. Recheck prn.  Gershon Crane, MD

## 2019-03-05 ENCOUNTER — Other Ambulatory Visit: Payer: Self-pay | Admitting: Adult Health

## 2019-03-05 DIAGNOSIS — J302 Other seasonal allergic rhinitis: Secondary | ICD-10-CM

## 2019-03-08 NOTE — Telephone Encounter (Signed)
Denied.  Pt has not established care.

## 2019-07-04 ENCOUNTER — Encounter: Payer: Self-pay | Admitting: Family Medicine

## 2019-07-04 ENCOUNTER — Ambulatory Visit: Payer: Self-pay | Admitting: *Deleted

## 2019-07-04 ENCOUNTER — Telehealth (INDEPENDENT_AMBULATORY_CARE_PROVIDER_SITE_OTHER): Payer: Managed Care, Other (non HMO) | Admitting: Family Medicine

## 2019-07-04 ENCOUNTER — Other Ambulatory Visit: Payer: Self-pay

## 2019-07-04 DIAGNOSIS — G43109 Migraine with aura, not intractable, without status migrainosus: Secondary | ICD-10-CM | POA: Diagnosis not present

## 2019-07-04 MED ORDER — SUMATRIPTAN SUCCINATE 100 MG PO TABS: 100.0000 mg | ORAL_TABLET | ORAL | 2 refills | Status: DC | PRN

## 2019-07-04 NOTE — Telephone Encounter (Signed)
Had what he called visuals yesterday then headache come on including some right eye vision disturbances for a short time that resolved. Woke up with a headache this morning at left temple behind the eye. 1000 mg tylenol has not helped any. Thinks Dr.Todd diagnosed him several years ago with a migraine but has had none since.  Denies stiff neck/fever/no contacts/no travels.Warm transferred for appointment.  Reason for Disposition . [1] MODERATE headache (e.g., interferes with normal activities) AND [2] present > 24 hours AND [3] unexplained  (Exceptions: analgesics not tried, typical migraine, or headache part of viral illness)  Answer Assessment - Initial Assessment Questions 1. LOCATION: "Where does it hurt?"      Left temple behind the eye 2. ONSET: "When did the headache start?" (Minutes, hours or days)     Intense headache yesterday all day 3. PATTERN: "Does the pain come and go, or has it been constant since it started?"     constant 4. SEVERITY: "How bad is the pain?" and "What does it keep you from doing?"  (e.g., Scale 1-10; mild, moderate, or severe)   - MILD (1-3): doesn't interfere with normal activities    - MODERATE (4-7): interferes with normal activities or awakens from sleep    - SEVERE (8-10): excruciating pain, unable to do any normal activities        3-4 on the scale this morning. Took 1000 mg tylenol, has not helped. 5. RECURRENT SYMPTOM: "Have you ever had headaches before?" If so, ask: "When was the last time?" and "What happened that time?"      Several years6. CAUSE: "What do you think is causing the headache?"     migraines 7. MIGRAINE: "Have you been diagnosed with migraine headaches?" If so, ask: "Is this headache similar?"      Yes by Dr. Sherren Mocha 8. HEAD INJURY: "Has there been any recent injury to the head?"      no 9. OTHER SYMPTOMS: "Do you have any other symptoms?" (fever, stiff neck, eye pain, sore throat, cold symptoms)     none 10. PREGNANCY: "Is there any  chance you are pregnant?" "When was your last menstrual period?"       na  Protocols used: HEADACHE-A-AH

## 2019-07-04 NOTE — Progress Notes (Signed)
Virtual Visit via Video Note  I connected with the patient on 07/04/19 at  9:00 AM EDT by a video enabled telemedicine application and verified that I am speaking with the correct person using two identifiers.  Location patient: home Location provider:work or home office Persons participating in the virtual visit: patient, provider  I discussed the limitations of evaluation and management by telemedicine and the availability of in person appointments. The patient expressed understanding and agreed to proceed.   HPI: Here for an episode that began at home about 5:30 pm yesterday. While he was washing dishes in the kitchen he had the sudden onset of blurry vision with "sqiggly lines" in his right visual field. This lasted about 45 minutes and then it faded away. As it was fading away he developed a headache in the left side of the head, from the left forehead to the temple. This has persisted until this morning. This is a throbbing type of pain and it is uncomfortable, but not severe. It is painful for him to look at bright lights. No nausea or vomiting. Today his vision is normal. No other neurologic deficits. He has taken 1000 mg of Tylenol with no relief. He has a hx of migraines, but these are infrequent. His last migraine was about 2 years ago. He is at work this morning.    ROS: See pertinent positives and negatives per HPI.  Past Medical History:  Diagnosis Date  . Panic attack     Past Surgical History:  Procedure Laterality Date  . HERNIA REPAIR    . TONSILLECTOMY    . TYMPANOPLASTY WITH GRAFT Right    per Dr. Narda Bonds   . TYMPANOSTOMY TUBE PLACEMENT      Family History  Problem Relation Age of Onset  . Alcohol abuse Father   . Cancer Sister        melanoma- scalp     Current Outpatient Medications:  .  montelukast (SINGULAIR) 10 MG tablet, , Disp: , Rfl:  .  SUMAtriptan (IMITREX) 100 MG tablet, Take 1 tablet (100 mg total) by mouth as needed for migraine. May  repeat in 2 hours if headache persists or recurs., Disp: 10 tablet, Rfl: 2  EXAM:  VITALS per patient if applicable:  GENERAL: alert, oriented, appears well and in no acute distress  HEENT: atraumatic, conjunttiva clear, no obvious abnormalities on inspection of external nose and ears  NECK: normal movements of the head and neck  LUNGS: on inspection no signs of respiratory distress, breathing rate appears normal, no obvious gross SOB, gasping or wheezing  CV: no obvious cyanosis  MS: moves all visible extremities without noticeable abnormality  PSYCH/NEURO: pleasant and cooperative, no obvious depression or anxiety, speech and thought processing grossly intact  ASSESSMENT AND PLAN: This sounds like a classic migraine headache which started off with a visual aura. He will take 800 mg of Ibuprofen as needed until the headache is gone. We will send in some Imitrex for him to use if this happens again. He will follow up as needed.  Gershon Crane, MD  Discussed the following assessment and plan:  No diagnosis found.     I discussed the assessment and treatment plan with the patient. The patient was provided an opportunity to ask questions and all were answered. The patient agreed with the plan and demonstrated an understanding of the instructions.   The patient was advised to call back or seek an in-person evaluation if the symptoms worsen or if the  condition fails to improve as anticipated.

## 2019-07-04 NOTE — Telephone Encounter (Signed)
Patient scheduled with Dr. Sarajane Jews for this issue

## 2019-07-25 ENCOUNTER — Encounter: Payer: Self-pay | Admitting: Family Medicine

## 2019-07-25 ENCOUNTER — Telehealth (INDEPENDENT_AMBULATORY_CARE_PROVIDER_SITE_OTHER): Payer: Managed Care, Other (non HMO) | Admitting: Family Medicine

## 2019-07-25 ENCOUNTER — Other Ambulatory Visit: Payer: Self-pay

## 2019-07-25 VITALS — Temp 98.1°F | Wt 250.0 lb

## 2019-07-25 DIAGNOSIS — R059 Cough, unspecified: Secondary | ICD-10-CM

## 2019-07-25 DIAGNOSIS — R05 Cough: Secondary | ICD-10-CM

## 2019-07-25 DIAGNOSIS — Z20822 Contact with and (suspected) exposure to covid-19: Secondary | ICD-10-CM

## 2019-07-25 DIAGNOSIS — R0981 Nasal congestion: Secondary | ICD-10-CM | POA: Diagnosis not present

## 2019-07-25 NOTE — Patient Instructions (Signed)
-  plenty of fluids and rest  -can use nasal saline, cough drops, humidifier as needed  -seek medical care of follow up if worsening or new concerns  -follow CDC guidelines for home isolation pending your test results. Please understand that test results are not perfect. A negative test does not rule out COVID - if you are sick stay home. If your symptoms worsen seek medical care. Advise masking and social distancing and good hand hygiene at all times when outside of your home or around others outside of your family throughout the pandemic.

## 2019-07-25 NOTE — Progress Notes (Signed)
Virtual Visit via Video Note  I connected with Darius Stone  on 07/25/19 at 11:00 AM EDT by a video enabled telemedicine application and verified that I am speaking with the correct person using two identifiers.  Location patient: home Location provider:work or home office Persons participating in the virtual visit: patient, provider  I discussed the limitations of evaluation and management by telemedicine and the availability of in person appointments. The patient expressed understanding and agreed to proceed.   HPI:  Acute visit for a sore throat: -started a few days ago on Oct 23rd - reports symptoms are mild and may be allergies but work wanted him to get [place] test, he did test this morning -symptoms include nasal congestion, sore throat, cough, mild SOB once when at the grocery store -denies: fevers, chest pain, body aches, diarrhea, loss of taste or smell -son has similar symptoms  -runs truck terminal, around a lot of people - stays back 6 feet for the most part, wears a mask if gets close to people, son is in school kindergarten - small groups of kids they are wearing mask -denies hx of asthma, sig smoking history, no healthy conditions except for obesity   ROS: See pertinent positives and negatives per HPI.  Past Medical History:  Diagnosis Date  . Panic attack     Past Surgical History:  Procedure Laterality Date  . HERNIA REPAIR    . TONSILLECTOMY    . TYMPANOPLASTY WITH GRAFT Right    per Dr. Radene Journey   . TYMPANOSTOMY TUBE PLACEMENT      Family History  Problem Relation Age of Onset  . Alcohol abuse Father   . Cancer Sister        melanoma- scalp    SOCIAL HX: see hpi   Current Outpatient Medications:  .  montelukast (SINGULAIR) 10 MG tablet, , Disp: , Rfl:  .  SUMAtriptan (IMITREX) 100 MG tablet, Take 1 tablet (100 mg total) by mouth as needed for migraine. May repeat in 2 hours if headache persists or recurs., Disp: 10 tablet, Rfl: 2  EXAM:  VITALS  per patient if applicable:denies fever  GENERAL: alert, oriented, appears well and in no acute distress  HEENT: atraumatic, conjunttiva clear, no obvious abnormalities on inspection of external nose and ears  NECK: normal movements of the head and neck  LUNGS: on inspection no signs of respiratory distress, breathing rate appears normal, no obvious gross SOB, gasping or wheezing  CV: no obvious cyanosis  MS: moves all visible extremities without noticeable abnormality  PSYCH/NEURO: pleasant and cooperative, no obvious depression or anxiety, speech and thought processing grossly intact  ASSESSMENT AND PLAN:  Discussed the following assessment and plan:  Nasal congestion  Cough  -we discussed possible serious and likely etiologies, options for evaluation and workup, limitations of telemedicine visit vs in person visit, treatment, treatment risks and precautions. Pt prefers to treat via telemedicine empirically rather then risking or undertaking an in person visit at this moment. Query AR, VURI most likely, possible mild COVID19. Home isolation and follow work guidelines for return to work if negative test. Advised of home isolation protocol. Symptomatic care discussed - he does not feel he requires any treatment at this time as reports symptoms are mild. Patient agrees to seek prompt medical care if worsening, new symptoms arise, or if is not improving with treatment.   I discussed the assessment and treatment plan with the patient. The patient was provided an opportunity to ask questions and all  were answered. The patient agreed with the plan and demonstrated an understanding of the instructions.   The patient was advised to call back or seek an in-person evaluation if the symptoms worsen or if the condition fails to improve as anticipated.   Terressa Koyanagi, DO

## 2019-07-27 LAB — NOVEL CORONAVIRUS, NAA: SARS-CoV-2, NAA: NOT DETECTED

## 2019-09-18 ENCOUNTER — Encounter: Payer: Managed Care, Other (non HMO) | Admitting: Family Medicine

## 2019-11-20 ENCOUNTER — Other Ambulatory Visit: Payer: Self-pay

## 2019-11-21 ENCOUNTER — Ambulatory Visit: Payer: Managed Care, Other (non HMO) | Admitting: Family Medicine

## 2019-11-21 ENCOUNTER — Encounter: Payer: Self-pay | Admitting: Family Medicine

## 2019-11-21 VITALS — BP 128/70 | HR 79 | Resp 12 | Ht 75.0 in | Wt 264.0 lb

## 2019-11-21 DIAGNOSIS — M87059 Idiopathic aseptic necrosis of unspecified femur: Secondary | ICD-10-CM

## 2019-11-21 DIAGNOSIS — Z789 Other specified health status: Secondary | ICD-10-CM

## 2019-11-21 LAB — COMPREHENSIVE METABOLIC PANEL
ALT: 23 U/L (ref 0–53)
AST: 20 U/L (ref 0–37)
Albumin: 4.9 g/dL (ref 3.5–5.2)
Alkaline Phosphatase: 68 U/L (ref 39–117)
BUN: 18 mg/dL (ref 6–23)
CO2: 30 mEq/L (ref 19–32)
Calcium: 10 mg/dL (ref 8.4–10.5)
Chloride: 102 mEq/L (ref 96–112)
Creatinine, Ser: 0.93 mg/dL (ref 0.40–1.50)
GFR: 90.59 mL/min (ref >60.00)
Glucose, Bld: 89 mg/dL (ref 70–99)
Potassium: 4.4 mEq/L (ref 3.5–5.1)
Sodium: 137 mEq/L (ref 135–145)
Total Bilirubin: 1.2 mg/dL (ref 0.2–1.2)
Total Protein: 7.2 g/dL (ref 6.0–8.3)

## 2019-11-21 NOTE — Patient Instructions (Addendum)
A few things to remember from today's visit:   Avascular necrosis of bone of hip, unspecified laterality ([place])  Alcohol consumption heavy - Plan: Comprehensive metabolic panel  Tai Chi may be beneficial for hip pain.  Please be sure medication list is accurate. If a new problem present, please set up appointment sooner than planned today.

## 2019-11-21 NOTE — Progress Notes (Signed)
HPI:   [name] is a 39 y.o. male, who is here today to establish care.  Former PCP: Dr. Sherren Stone. Last preventive routine visit: Has not had one in years.  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 He has not had a migraine in 2 years.  He follows with orthopedist. S/P total left hip arthroplasty. Intermittent right hip pain, exacerbated by certain activities. He takes ibuprofen 800 mg if needed, not frequent. He saw his orthopedist 6 months ago.  No history of trauma or steroid use.  He stopped alcohol intake 2 weeks ago. 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 Negative for abdominal pain, nausea, vomiting, changes in bowel habits, jaundice, easy bruising/bleeding, numbness, or tingling.  Lab Results  Component Value Date   ALT 46 04/07/2012   AST 33 04/07/2012   ALKPHOS 68 04/07/2012   BILITOT 1.3 (H) 04/07/2012   He has no concerns today.  Review of Systems  Constitutional: Negative for activity change, appetite change, fatigue and fever.  HENT: Negative for mouth sores, nosebleeds, sore throat and trouble swallowing.   Respiratory: Negative for cough, shortness of breath and wheezing.   Cardiovascular: Negative for chest pain, palpitations and leg swelling.  Genitourinary: Negative for decreased urine volume and hematuria.  Neurological: Negative for seizures, syncope, weakness and headaches.  Psychiatric/Behavioral: Negative for confusion and hallucinations.  Rest see pertinent positives and negatives per HPI.  No current outpatient medications on file prior to visit.   No current facility-administered medications on file prior to visit.   Past Medical History:  Diagnosis Date  . Panic attack    Allergies  Allergen Reactions  . Penicillins     Unknown reaction    Family History  Problem Relation Age of Onset  . Alcohol abuse Father   . Cancer Sister    melanoma- scalp    Social History   Socioeconomic History  . Marital status: Single    Spouse name: Not on file  . Number of children: Not on file  . Years of education: Not on file  . Highest education level: Not on file  Occupational History  . Not on file  Tobacco Use  . Smoking status: Former Research scientist (life sciences)  . Smokeless tobacco: Never Used  Substance and Sexual Activity  . Alcohol use: Yes  . Drug use: Yes  . Sexual activity: Not on file  Other Topics Concern  . Not on file  Social History Narrative  . Not on file   Social Determinants of Health   Financial Resource Strain:   . Difficulty of Paying Living Expenses: Not on file  Food Insecurity:   . Worried About Charity fundraiser in the Last Year: Not on file  . Ran Out of Food in the Last Year: Not on file  Transportation Needs:   . Lack of Transportation (Medical): Not on file  . Lack of Transportation (Non-Medical): Not on file  Physical Activity:   . Days of Exercise per Week: Not on file  . Minutes of Exercise per Session: Not on file  Stress:   . Feeling of Stress : Not on file  Social Connections:   . Frequency of Communication with Friends and Family: Not on file  . Frequency of Social Gatherings with Friends and Family: Not on file  . Attends Religious Services: Not on file  . Active Member of Clubs or Organizations: Not on file  .  Attends Archivist Meetings: Not on file  . Marital Status: Not on file    Vitals:   11/21/19 0753  BP: 128/70  Pulse: 79  Resp: 12  SpO2: 97%   Body mass index is 33 kg/m.  Physical Exam  Nursing note reviewed. Constitutional: He is oriented to person, place, and time. He appears well-developed. No distress.  HENT:  Head: Normocephalic and atraumatic.  Mouth/Throat: Oropharynx is clear and moist and mucous membranes are normal.  Eyes: Pupils are equal, round, and reactive to light. Conjunctivae are normal.  Cardiovascular: Normal rate and regular rhythm.   No murmur heard. Pulses:      Posterior tibial pulses are 2+ on the right side and 2+ on the left side.  Respiratory: Effort normal and breath sounds normal. No respiratory distress.  GI: Soft. He exhibits no mass. There is no hepatomegaly. There is no abdominal tenderness.  Musculoskeletal:        General: No edema.  Lymphadenopathy:    He has no cervical adenopathy.  Neurological: He is alert and oriented to person, place, and time. He has normal strength. No cranial nerve deficit. Gait normal.  Skin: Skin is warm. No rash noted. No erythema.  Psychiatric: He has a normal mood and affect.  Well groomed, good eye contact.   ASSESSMENT AND PLAN:   [name] was seen today for establish care.  Diagnoses and all orders for this visit:  Lab Results  Component Value Date   ALT 23 11/21/2019   AST 20 11/21/2019   ALKPHOS 68 11/21/2019   BILITOT 1.2 11/21/2019   Lab Results  Component Value Date   CREATININE 0.93 11/21/2019   BUN 18 11/21/2019   NA 137 11/21/2019   K 4.4 11/21/2019   CL 102 11/21/2019   CO2 30 11/21/2019    Avascular necrosis of bone of hip, unspecified laterality ([facility]) Low impact exercise, tai chi may be a good option for him. Some side effects of chronic NSAID's use. He is not interested in new medications.  Following with orthopedics.  Alcohol consumption heavy Adverse effects of heavy alcohol intake discussed. Encouraged to continue avoiding alcohol. Further recommendation will be given according to lab results.  -     Comprehensive metabolic panel   Return in about 1 year (around 11/20/2020).     Betty G. Martinique, MD  [facility]. [place] office.

## 2019-12-26 ENCOUNTER — Ambulatory Visit (INDEPENDENT_AMBULATORY_CARE_PROVIDER_SITE_OTHER): Payer: Managed Care, Other (non HMO)

## 2019-12-26 ENCOUNTER — Other Ambulatory Visit: Payer: Self-pay

## 2019-12-26 ENCOUNTER — Encounter: Payer: Self-pay | Admitting: Family Medicine

## 2019-12-26 ENCOUNTER — Ambulatory Visit (INDEPENDENT_AMBULATORY_CARE_PROVIDER_SITE_OTHER): Payer: Managed Care, Other (non HMO) | Admitting: Family Medicine

## 2019-12-26 VITALS — BP 120/72 | HR 76 | Temp 95.9°F | Resp 12 | Ht 75.0 in | Wt 269.0 lb

## 2019-12-26 DIAGNOSIS — J301 Allergic rhinitis due to pollen: Secondary | ICD-10-CM | POA: Diagnosis not present

## 2019-12-26 DIAGNOSIS — R0789 Other chest pain: Secondary | ICD-10-CM | POA: Diagnosis not present

## 2019-12-26 MED ORDER — OMEPRAZOLE 40 MG PO CPDR: 40.0000 mg | DELAYED_RELEASE_CAPSULE | Freq: Every day | ORAL | 1 refills | Status: DC

## 2019-12-26 NOTE — Progress Notes (Signed)
ACUTE VISIT: Chief Complaint  Patient presents with  . Chest Pain   HPI: Darius Stone is a 39 y.o. male, who is here today with above concern. 3-4 weeks of retrosternal pain, occasionally radiated to right and left side of chest. It was intermittent but for the past few days has been constant. It is not associated with dyspnea,palpitations,diaphoresis,nausea,abdominal pain,vomiting,or melena. He has not noted fever,chills,sore throat, cough,or wheezing. No recent URI.  It is worse at night when lying down, it is interfering with sleep.  Achy like pain 3-4/10.  He has taken Motrin 400 mg tid but does not help.  No hx of injuries or unusual physical activity. He has been doing push ups for 3-4 months,stopped 3-4 weeks ago because of pain.  He remembers having similar pain 2-3 years ago but resolved within 5 days.  Not sure about heartburn or acid reflux because he has not had these symptoms before. No associated with food intake.  Negative for LE edema ,erythema,orthopnea,or PND.  Allergies: Requesting refills for Singulair 10 mg, which he takes around this time of the year to prevent sinus congestion and frontal headache. If he does not take medication nasal congestion and frontal headache are exacerbated by yard work, Singulair has prevented symptoms.  Review of Systems  Constitutional: Negative for activity change, appetite change and fatigue.  HENT: Negative for mouth sores, nosebleeds and trouble swallowing.   Eyes: Negative for redness and visual disturbance.  Cardiovascular: Negative for chest pain, palpitations and leg swelling.  Genitourinary: Negative for decreased urine volume and hematuria.  Musculoskeletal: Negative for gait problem and joint swelling.  Skin: Negative for pallor and rash.  Allergic/Immunologic: Positive for environmental allergies.  Neurological: Negative for syncope and weakness.  Psychiatric/Behavioral: Positive for sleep  disturbance. Negative for confusion. The patient is nervous/anxious.   Rest see pertinent positives and negatives per HPI.  No current outpatient medications on file prior to visit.   No current facility-administered medications on file prior to visit.   Past Medical History:  Diagnosis Date  . Panic attack    Allergies  Allergen Reactions  . Penicillins     Unknown reaction    Social History   Socioeconomic History  . Marital status: Single    Spouse name: Not on file  . Number of children: Not on file  . Years of education: Not on file  . Highest education level: Not on file  Occupational History  . Not on file  Tobacco Use  . Smoking status: Former Games developer  . Smokeless tobacco: Never Used  Substance and Sexual Activity  . Alcohol use: Yes  . Drug use: Yes  . Sexual activity: Not on file  Other Topics Concern  . Not on file  Social History Narrative  . Not on file   Social Determinants of Health   Financial Resource Strain:   . Difficulty of Paying Living Expenses:   Food Insecurity:   . Worried About Programme researcher, broadcasting/film/video in the Last Year:   . Barista in the Last Year:   Transportation Needs:   . Freight forwarder (Medical):   Marland Kitchen Lack of Transportation (Non-Medical):   Physical Activity:   . Days of Exercise per Week:   . Minutes of Exercise per Session:   Stress:   . Feeling of Stress :   Social Connections:   . Frequency of Communication with Friends and Family:   . Frequency of Social Gatherings with  Friends and Family:   . Attends Religious Services:   . Active Member of Clubs or Organizations:   . Attends Archivist Meetings:   Marland Kitchen Marital Status:     Vitals:   12/26/19 1433  BP: 120/72  Pulse: 76  Resp: 12  Temp: (!) 95.9 F (35.5 C)  SpO2: 97%   Body mass index is 33.62 kg/m.   Physical Exam  Nursing note and vitals reviewed. Constitutional: He is oriented to person, place, and time. He appears well-developed. No  distress.  HENT:  Head: Normocephalic and atraumatic.  Mildly hypertrophic turbinates.  Eyes: Pupils are equal, round, and reactive to light. Conjunctivae are normal.  Cardiovascular: Normal rate and regular rhythm.  No murmur heard. Pulses:      Dorsalis pedis pulses are 2+ on the right side and 2+ on the left side.  Respiratory: Effort normal and breath sounds normal. No respiratory distress. He exhibits no tenderness.  Some improvement of achy chest pain when leaning forward. There is no pain with deep breathing,cough,or movement.  GI: Soft. He exhibits no mass. There is no hepatomegaly. There is no abdominal tenderness.  Musculoskeletal:        General: No edema.     Comments: Pain is not elicited with shoulder abduction.  Lymphadenopathy:    He has no cervical adenopathy.  Neurological: He is alert and oriented to person, place, and time. He has normal strength. No cranial nerve deficit. Gait normal.  Skin: Skin is warm. No rash noted. No erythema.  Psychiatric: His mood appears anxious.  Well groomed, good eye contact.   ASSESSMENT AND PLAN:  [name] was seen today for chest pain.  Diagnoses and all orders for this visit:  Orders Placed This Encounter  Procedures  . DG Chest 2 View  . CBC  . EKG 12-Lead   Lab Results  Component Value Date   WBC 5.4 12/26/2019   HGB 14.8 12/26/2019   HCT 43.5 12/26/2019   MCV 85.5 12/26/2019   PLT 207.0 12/26/2019    Other chest pain We discussed possible etiologies. Hx does not suggest cardiac etiology. Pain is not aggravated by movement of palpation,still musculoskeletal pain, ? Muscle strain is not discarded. Pain was improved when I asking to bend forward, hx does not suggest endocarditis. EKG today abnormal, no other EKG for comparison. Sinus bradycardia, ? IVCD. Early repolarization variant. No ST segment changes  CXR I do not appreciate acute process.  He was clearly instructed about warning signs. If pain is  persistent or gets worse chest CT can be considered. F/U in 2 months.  Other orders ? GI etiology. He agrees with PPI trial. Motrin is not helping,so discontinued. GERD precautions given.  -     omeprazole (PRILOSEC) 40 MG capsule; Take 1 capsule (40 mg total) by mouth daily.  Allergic rhinitis due to pollen, unspecified seasonality Start Singulair 10 mg daily until beginning of summer. Nasal irrigations with saline will also help.   Return in about 2 months (around 02/25/2020).   Debria Broecker G. [place], MD  [facility]. [place] office.   A few things to remember from today's visit:   Other chest pain - Plan: EKG 12-Lead, DG Chest 2 View, CBC  Not sure about specific cause. ? Esophagitis. Recommend trial of omeprazole 40 mg 30-minute before breakfast. GERD precautions also recommended. Stop ibuprofen.  Monitor for new symptoms.

## 2019-12-26 NOTE — Patient Instructions (Addendum)
A few things to remember from today's visit:   Other chest pain - Plan: EKG 12-Lead, DG Chest 2 View, CBC  Not sure about specific cause. ? Esophagitis. Recommend trial of omeprazole 40 mg 30-minute before breakfast. GERD precautions also recommended. Stop ibuprofen.  Monitor for new symptoms.

## 2019-12-27 ENCOUNTER — Encounter: Payer: Self-pay | Admitting: Family Medicine

## 2019-12-27 LAB — CBC
HCT: 43.5 % (ref 39.0–52.0)
Hemoglobin: 14.8 g/dL (ref 13.0–17.0)
MCHC: 34 g/dL (ref 30.0–36.0)
MCV: 85.5 fl (ref 78.0–100.0)
Platelets: 207 10*3/uL (ref 150.0–400.0)
RBC: 5.09 Mil/uL (ref 4.22–5.81)
RDW: 13.1 % (ref 11.5–15.5)
WBC: 5.4 10*3/uL (ref 4.0–10.5)

## 2019-12-27 MED ORDER — MONTELUKAST SODIUM 10 MG PO TABS: 10.0000 mg | ORAL_TABLET | Freq: Every day | ORAL | 3 refills | Status: DC

## 2020-01-11 ENCOUNTER — Ambulatory Visit (INDEPENDENT_AMBULATORY_CARE_PROVIDER_SITE_OTHER): Payer: Managed Care, Other (non HMO) | Admitting: Adult Health

## 2020-01-11 ENCOUNTER — Telehealth: Payer: Self-pay | Admitting: Family Medicine

## 2020-01-11 ENCOUNTER — Other Ambulatory Visit: Payer: Self-pay

## 2020-01-11 ENCOUNTER — Encounter: Payer: Self-pay | Admitting: Adult Health

## 2020-01-11 VITALS — BP 128/86 | Temp 98.0°F | Wt 271.0 lb

## 2020-01-11 DIAGNOSIS — T148XXA Other injury of unspecified body region, initial encounter: Secondary | ICD-10-CM

## 2020-01-11 DIAGNOSIS — M940 Chondrocostal junction syndrome [Tietze]: Secondary | ICD-10-CM

## 2020-01-11 MED ORDER — CYCLOBENZAPRINE HCL 10 MG PO TABS: 10.0000 mg | ORAL_TABLET | Freq: Three times a day (TID) | ORAL | 0 refills | Status: DC | PRN

## 2020-01-11 MED ORDER — METHYLPREDNISOLONE 4 MG PO TBPK: ORAL_TABLET | ORAL | 0 refills | Status: DC

## 2020-01-11 NOTE — Progress Notes (Signed)
Subjective:    Patient ID: Darius Stone, male    DOB: Oct 24, 1980, 39 y.o.   MRN: [id number]  HPI 39 year old male who  has a past medical history of Panic attack.   He was seen by his PCP on 12/26/2019 with retrosternal pain that had been present for 3 to 4 weeks prior.  This pain occasionally radiated to the left and right side of chest.  At the time of presentation the pain had been more consistent.  He had no associated dyspnea, palpitations, diaphoresis, nausea, abdominal pain, vomiting, or melena.  Pain was worse at night when laying down and interfering with his sleep.  This pain was described as an achy pain.  He was taking Motrin 400 mg but this did not help.  He had no history of injuries or unusual physical activity.  His EKG showed sinus bradycardia with a rate of 56 and nonspecific QRS widening.  His chest x-ray was unremarkable  Today he reports that he woke up this morning and noticed that he had chest pain over his left pectoral.  He also noticed that his scapula was sore this morning.  Pain is still described as an achy pain.  Pain is intermittent and sometimes he notices the discomfort when he takes a deep breath other times the pain is just present.  Motrin has not been helping.    Review of Systems See HPI   Past Medical History:  Diagnosis Date  . Panic attack     Social History   Socioeconomic History  . Marital status: Single    Spouse name: Not on file  . Number of children: Not on file  . Years of education: Not on file  . Highest education level: Not on file  Occupational History  . Not on file  Tobacco Use  . Smoking status: Former Games developer  . Smokeless tobacco: Never Used  Substance and Sexual Activity  . Alcohol use: Yes  . Drug use: Yes  . Sexual activity: Not on file  Other Topics Concern  . Not on file  Social History Narrative  . Not on file   Social Determinants of Health   Financial Resource Strain:   . Difficulty of Paying Living  Expenses:   Food Insecurity:   . Worried About Programme researcher, broadcasting/film/video in the Last Year:   . Barista in the Last Year:   Transportation Needs:   . Freight forwarder (Medical):   Marland Kitchen Lack of Transportation (Non-Medical):   Physical Activity:   . Days of Exercise per Week:   . Minutes of Exercise per Session:   Stress:   . Feeling of Stress :   Social Connections:   . Frequency of Communication with Friends and Family:   . Frequency of Social Gatherings with Friends and Family:   . Attends Religious Services:   . Active Member of Clubs or Organizations:   . Attends Banker Meetings:   Marland Kitchen Marital Status:   Intimate Partner Violence:   . Fear of Current or Ex-Partner:   . Emotionally Abused:   Marland Kitchen Physically Abused:   . Sexually Abused:     Past Surgical History:  Procedure Laterality Date  . HERNIA REPAIR    . TONSILLECTOMY    . TOTAL HIP ARTHROPLASTY Left 07/22/2018  . TYMPANOPLASTY WITH GRAFT Right    per Dr. Narda Bonds   . TYMPANOSTOMY TUBE PLACEMENT      Family History  Problem Relation Age of Onset  . Alcohol abuse Father   . Cancer Sister        melanoma- scalp    Allergies  Allergen Reactions  . Penicillins     Unknown reaction    Current Outpatient Medications on File Prior to Visit  Medication Sig Dispense Refill  . omeprazole (PRILOSEC) 40 MG capsule Take 1 capsule (40 mg total) by mouth daily. 30 capsule 1  . montelukast (SINGULAIR) 10 MG tablet Take 1 tablet (10 mg total) by mouth at bedtime. (Patient not taking: Reported on 01/11/2020) 30 tablet 3   No current facility-administered medications on file prior to visit.    BP 128/86   Temp 98 F (36.7 C)   Wt 271 lb (122.9 kg)   BMI 33.87 kg/m       Objective:   Physical Exam Vitals and nursing note reviewed.  Constitutional:      Appearance: Normal appearance.  Cardiovascular:     Rate and Rhythm: Normal rate and regular rhythm.     Pulses: Normal pulses.     Heart  sounds: Normal heart sounds.  Pulmonary:     Effort: Pulmonary effort is normal.     Breath sounds: Normal breath sounds.  Musculoskeletal:        General: Tenderness present.     Comments: Tenderness with deep palpation to third intercostal space.  He had relief of pain with deep palpation along the left scapula.  Trapezius muscle was tight in this area  Neurological:     General: No focal deficit present.     Mental Status: He is alert and oriented to person, place, and time.  Psychiatric:        Mood and Affect: Mood normal.        Behavior: Behavior normal.        Thought Content: Thought content normal.        Judgment: Judgment normal.       Assessment & Plan:  1. Costochondritis -Exam consistent with costochondritis.  Will prescribe Medrol Dosepak and Flexeril to see if this helps with his discomfort.  He was also advised warm compress.  Follow-up if no improvement over the next 3 to 4 days - methylPREDNISolone (MEDROL DOSEPAK) 4 MG TBPK tablet; Take as directed  Dispense: 21 tablet; Refill: 0 - cyclobenzaprine (FLEXERIL) 10 MG tablet; Take 1 tablet (10 mg total) by mouth 3 (three) times daily as needed for muscle spasms.  Dispense: 15 tablet; Refill: 0  2. Muscle strain  - methylPREDNISolone (MEDROL DOSEPAK) 4 MG TBPK tablet; Take as directed  Dispense: 21 tablet; Refill: 0 - cyclobenzaprine (FLEXERIL) 10 MG tablet; Take 1 tablet (10 mg total) by mouth 3 (three) times daily as needed for muscle spasms.  Dispense: 15 tablet; Refill: 0  Dorothyann Peng, NP

## 2020-01-11 NOTE — Progress Notes (Signed)
   Subjective:    Patient ID: Darius Stone, male    DOB: [birth year], 39 y.o.   MRN: [id number]  HPI    Review of Systems     Objective:   Physical Exam        Assessment & Plan:

## 2020-01-25 ENCOUNTER — Telehealth: Payer: Self-pay | Admitting: Family Medicine

## 2020-01-25 ENCOUNTER — Ambulatory Visit: Payer: Managed Care, Other (non HMO) | Admitting: Adult Health

## 2020-01-25 NOTE — Telephone Encounter (Signed)
Medication Refill: Omeprazole [facility]: [facility] [phone] [facility] FAX: [phone]    Insurance does not cover this medication and the pt can get it cheaper at [facility] pharmacy  Pt states he only has two pills left.

## 2020-01-26 ENCOUNTER — Other Ambulatory Visit: Payer: Self-pay | Admitting: *Deleted

## 2020-01-26 MED ORDER — OMEPRAZOLE 40 MG PO CPDR: 40.0000 mg | DELAYED_RELEASE_CAPSULE | Freq: Every day | ORAL | 1 refills | Status: DC

## 2020-01-26 NOTE — Telephone Encounter (Signed)
Rx sent to pharmacy as requested.

## 2020-03-08 ENCOUNTER — Other Ambulatory Visit: Payer: Self-pay

## 2020-03-11 ENCOUNTER — Encounter: Payer: Self-pay | Admitting: Family Medicine

## 2020-03-11 ENCOUNTER — Ambulatory Visit (INDEPENDENT_AMBULATORY_CARE_PROVIDER_SITE_OTHER): Payer: Managed Care, Other (non HMO) | Admitting: Family Medicine

## 2020-03-11 ENCOUNTER — Other Ambulatory Visit: Payer: Self-pay

## 2020-03-11 VITALS — BP 122/82 | HR 76 | Temp 97.1°F | Resp 12 | Ht 75.0 in | Wt 276.5 lb

## 2020-03-11 DIAGNOSIS — K219 Gastro-esophageal reflux disease without esophagitis: Secondary | ICD-10-CM | POA: Diagnosis not present

## 2020-03-11 DIAGNOSIS — E6609 Other obesity due to excess calories: Secondary | ICD-10-CM

## 2020-03-11 DIAGNOSIS — R0789 Other chest pain: Secondary | ICD-10-CM | POA: Diagnosis not present

## 2020-03-11 DIAGNOSIS — Z6834 Body mass index (BMI) 34.0-34.9, adult: Secondary | ICD-10-CM | POA: Diagnosis not present

## 2020-03-11 DIAGNOSIS — E669 Obesity, unspecified: Secondary | ICD-10-CM | POA: Insufficient documentation

## 2020-03-11 MED ORDER — OMEPRAZOLE 40 MG PO CPDR: 40.0000 mg | DELAYED_RELEASE_CAPSULE | Freq: Every day | ORAL | 2 refills | Status: DC

## 2020-03-11 NOTE — Assessment & Plan Note (Signed)
Problem is well controlled. Recommend trying to decrease the frequency of omeprazole, he can take omeprazole 40 mg every other day. Continue GERD precautions. As far as problem is well controlled, it is okay to follow annually.

## 2020-03-11 NOTE — Assessment & Plan Note (Addendum)
Since his last visit, 12/26/2019, he has gained about 7 pounds. His main source of calories seems to be alcohol, so recommend decreasing amount and frequency. We discussed possible complication of high alcohol intake.  Regular physical activity will also help.

## 2020-03-11 NOTE — Progress Notes (Signed)
HPI: Darius Stone is a 39 y.o. male, who is here today for follow up.   He was last seen on 12/26/2019, when he was c/o 3 to 4 weeks of retrosternal pain.. Since his last visit he was evaluated for chest pain, 01/11/2020, diagnosed with costochondritis.  Chest pain has resolved.  GERD:Currently he is on omeprazole 40 mg daily, he has been asymptomatic since he started taking medication. He is taking a daily. Problem is exacerbated by very spicy food and sometimes alcohol.  He is trying to decrease alcohol intake. He just came back from vacation, so until a few days ago he was drinking quite a bit. Negative for abdominal pain, nausea, vomiting, changes in bowel habits, blood in stool or melena.  He is not exercising regularly and non consistent with following a healthful diet.  Review of Systems  Constitutional: Negative for activity change, appetite change, fatigue and fever.  HENT: Negative for mouth sores, nosebleeds, sore throat and trouble swallowing.   Respiratory: Negative for cough, shortness of breath and wheezing.   Cardiovascular: Negative for palpitations and leg swelling.  Musculoskeletal: Negative for gait problem and myalgias.  Neurological: Negative for syncope, weakness and headaches.  Psychiatric/Behavioral: Negative for behavioral problems and confusion.  Rest of ROS, see pertinent positives sand negatives in HPI  Current Outpatient Medications on File Prior to Visit  Medication Sig Dispense Refill  . montelukast (SINGULAIR) 10 MG tablet Take 1 tablet (10 mg total) by mouth at bedtime. (Patient not taking: Reported on 01/11/2020) 30 tablet 3   No current facility-administered medications on file prior to visit.   Past Medical History:  Diagnosis Date  . Panic attack    Allergies  Allergen Reactions  . Penicillins     Unknown reaction   Social History   Socioeconomic History  . Marital status: Single    Spouse name: Not on file  . Number of  children: Not on file  . Years of education: Not on file  . Highest education level: Not on file  Occupational History  . Not on file  Tobacco Use  . Smoking status: Former Research scientist (life sciences)  . Smokeless tobacco: Never Used  Substance and Sexual Activity  . Alcohol use: Yes  . Drug use: Yes  . Sexual activity: Not on file  Other Topics Concern  . Not on file  Social History Narrative  . Not on file   Social Determinants of Health   Financial Resource Strain:   . Difficulty of Paying Living Expenses:   Food Insecurity:   . Worried About Charity fundraiser in the Last Year:   . Arboriculturist in the Last Year:   Transportation Needs:   . Film/video editor (Medical):   Marland Kitchen Lack of Transportation (Non-Medical):   Physical Activity:   . Days of Exercise per Week:   . Minutes of Exercise per Session:   Stress:   . Feeling of Stress :   Social Connections:   . Frequency of Communication with Friends and Family:   . Frequency of Social Gatherings with Friends and Family:   . Attends Religious Services:   . Active Member of Clubs or Organizations:   . Attends Archivist Meetings:   Marland Kitchen Marital Status:     Vitals:   03/11/20 1114  BP: 122/82  Pulse: 76  Resp: 12  Temp: (!) 97.1 F (36.2 C)  SpO2: 96%   Wt Readings from Last 3 Encounters:  03/11/20 276 lb 8 oz (125.4 kg)  01/11/20 271 lb (122.9 kg)  12/26/19 269 lb (122 kg)   Body mass index is 34.56 kg/m.  Physical Exam  Nursing note and vitals reviewed. Constitutional: He is oriented to person, place, and time. He appears well-developed. No distress.  HENT:  Head: Normocephalic and atraumatic.  Mouth/Throat: Mucous membranes are moist.  Eyes: Conjunctivae are normal.  Cardiovascular: Normal rate and regular rhythm.  No murmur heard. Respiratory: Effort normal and breath sounds normal. No respiratory distress.  GI: Soft. Normal appearance. He exhibits no mass. There is no hepatomegaly. There is no abdominal  tenderness.  Musculoskeletal:     Right lower leg: No edema.     Left lower leg: No edema.  Neurological: He is alert and oriented to person, place, and time. No cranial nerve deficit. Gait normal.  Skin: Skin is warm. No rash noted. No erythema.  Psychiatric:  Well groomed, good eye contact.   ASSESSMENT AND PLAN:  [name] was seen today for follow-up.  No orders of the defined types were placed in this encounter.  GERD (gastroesophageal reflux disease) Problem is well controlled. Recommend trying to decrease the frequency of omeprazole, he can take omeprazole 40 mg every other day. Continue GERD precautions. As far as problem is well controlled, it is okay to follow annually.  Class 1 obesity with body mass index (BMI) of 34.0 to 34.9 in adult Since his last visit, 12/26/2019, he has gained about 7 pounds. His main source of calories seems to be alcohol, so recommend decreasing amount and frequency. We discussed possible complication of high alcohol intake.  Regular physical activity will also help.  Other chest pain GERD played a role. Problem has resolved. Explained that the probability of this being related with a serious CV process is low but never 0. Healthy life style for CVD prevention recommended.  Instructed about warning signs.  Return in about 1 year (around 03/11/2021) for cpe.  Jemila Camille G. [place], MD  [facility]. [place] office.  A few things to remember from today's visit:   Try to skip Omeprazole every other day and see if still helps. GERD precautions to continue.  If you need refills please call your pharmacy. Do not use My Chart to request refills or for acute issues that need immediate attention.    Please be sure medication list is accurate. If a new problem present, please set up appointment sooner than planned today.

## 2020-03-11 NOTE — Patient Instructions (Signed)
A few things to remember from today's visit:   Try to skip Omeprazole every other day and see if still helps. GERD precautions to continue.  If you need refills please call your pharmacy. Do not use My Chart to request refills or for acute issues that need immediate attention.    Please be sure medication list is accurate. If a new problem present, please set up appointment sooner than planned today.

## 2020-05-17 ENCOUNTER — Ambulatory Visit: Payer: Managed Care, Other (non HMO) | Admitting: Family Medicine

## 2020-05-17 ENCOUNTER — Encounter: Payer: Self-pay | Admitting: Family Medicine

## 2020-05-17 ENCOUNTER — Telehealth: Payer: Self-pay | Admitting: Family Medicine

## 2020-05-17 ENCOUNTER — Ambulatory Visit
Admission: EM | Admit: 2020-05-17 | Discharge: 2020-05-17 | Disposition: A | Discharge status: Home / Self Care | Payer: Managed Care, Other (non HMO)

## 2020-05-17 ENCOUNTER — Other Ambulatory Visit: Payer: Self-pay

## 2020-05-17 VITALS — BP 122/78 | HR 91 | Temp 98.5°F | Wt 283.1 lb

## 2020-05-17 DIAGNOSIS — H60501 Unspecified acute noninfective otitis externa, right ear: Secondary | ICD-10-CM | POA: Diagnosis not present

## 2020-05-17 MED ORDER — CIPROFLOXACIN-DEXAMETHASONE 0.3-0.1 % OT SUSP: 4.0000 [drp] | Freq: Two times a day (BID) | OTIC | 0 refills | Status: DC

## 2020-05-17 NOTE — Telephone Encounter (Signed)
Pt call and stated he saw dr.Burchette for ear pain and want the medication sent to [facility] on [address] telephone @ is [phone].

## 2020-05-17 NOTE — Patient Instructions (Signed)
Otitis Externa  Otitis externa is an infection of the outer ear canal. The outer ear canal is the area between the outside of the ear and the eardrum. Otitis externa is sometimes called swimmer's ear. What are the causes? Common causes of this condition include:  Swimming in dirty water.  Moisture in the ear.  An injury to the inside of the ear.  An object stuck in the ear.  A cut or scrape on the outside of the ear. What increases the risk? You are more likely to develop this condition if you go swimming often. What are the signs or symptoms? The first symptom of this condition is often itching in the ear. Later symptoms of the condition include:  Swelling of the ear.  Redness in the ear.  Ear pain. The pain may get worse when you pull on your ear.  Pus coming from the ear. How is this diagnosed? This condition may be diagnosed by examining the ear and testing fluid from the ear for bacteria and funguses. How is this treated? This condition may be treated with:  Antibiotic ear drops. These are often given for 10-14 days.  Medicines to reduce itching and swelling. Follow these instructions at home:  If you were prescribed antibiotic ear drops, use them as told by your health care provider. Do not stop using the antibiotic even if your condition improves.  Take over-the-counter and prescription medicines only as told by your health care provider.  Avoid getting water in your ears as told by your health care provider. This may include avoiding swimming or water sports for a few days.  Keep all follow-up visits as told by your health care provider. This is important. How is this prevented?  Keep your ears dry. Use the corner of a towel to dry your ears after you swim or bathe.  Avoid scratching or putting things in your ear. Doing these things can damage the ear canal or remove the protective wax that lines it, which makes it easier for bacteria and funguses to  grow.  Avoid swimming in lakes, polluted water, or pools that may not have enough chlorine. Contact a health care provider if:  You have a fever.  Your ear is still red, swollen, painful, or draining pus after 3 days.  Your redness, swelling, or pain gets worse.  You have a severe headache.  You have redness, swelling, pain, or tenderness in the area behind your ear. Summary  Otitis externa is an infection of the outer ear canal.  Common causes include swimming in dirty water, moisture in the ear, or a cut or scrape in the ear.  Symptoms include pain, redness, and swelling of the ear.  If you were prescribed antibiotic ear drops, use them as told by your health care provider. Do not stop using the antibiotic even if your condition improves. This information is not intended to replace advice given to you by your health care provider. Make sure you discuss any questions you have with your health care provider. Document Revised: 02/18/2018 Document Reviewed: 02/18/2018 Elsevier Patient Education  2020 [employer] Inc[employer]  

## 2020-05-17 NOTE — Discharge Instructions (Addendum)
Continue the Ciprodex eardrops as directed.    Follow up with your primary care provider if your symptoms are not improving.

## 2020-05-17 NOTE — Progress Notes (Signed)
Established Patient Office Visit  Subjective:  Patient ID: Darius Stone, male    DOB: 1981-07-20  Age: 39 y.o. MRN: [id number]  CC:  Chief Complaint  Patient presents with  . Ear Pain    right ear pain for a couple of days tried peroxide     HPI Darius Stone presents for right ear pain.  He states [place] the beach [place] with his [place] but did not do a lot of swimming.  Started having pain couple days ago.  has had a little bit of discharge at night.  He has used some hydrogen peroxide.  His concern is that Monday he flies to [facility] on business.  No recent fever.  No hearing loss.  No vertigo.  Past Medical History:  Diagnosis Date  . Panic attack     Past Surgical History:  Procedure Laterality Date  . HERNIA REPAIR    . TONSILLECTOMY    . TOTAL HIP ARTHROPLASTY Left 07/22/2018  . TYMPANOPLASTY WITH GRAFT Right    per Dr. Narda Bonds   . TYMPANOSTOMY TUBE PLACEMENT      Family History  Problem Relation Age of Onset  . Alcohol abuse Father   . Cancer Sister        melanoma- scalp    Social History   Socioeconomic History  . Marital status: Single    Spouse name: Not on file  . Number of children: Not on file  . Years of education: Not on file  . Highest education level: Not on file  Occupational History  . Not on file  Tobacco Use  . Smoking status: Former Games developer  . Smokeless tobacco: Never Used  Substance and Sexual Activity  . Alcohol use: Yes  . Drug use: Yes  . Sexual activity: Not on file  Other Topics Concern  . Not on file  Social History Narrative  . Not on file   Social Determinants of Health   Financial Resource Strain:   . Difficulty of Paying Living Expenses: Not on file  Food Insecurity:   . Worried About Programme researcher, broadcasting/film/video in the Last Year: Not on file  . Ran Out of Food in the Last Year: Not on file  Transportation Needs:   . Lack of Transportation (Medical): Not on file  . Lack of Transportation (Non-Medical):  Not on file  Physical Activity:   . Days of Exercise per Week: Not on file  . Minutes of Exercise per Session: Not on file  Stress:   . Feeling of Stress : Not on file  Social Connections:   . Frequency of Communication with Friends and Family: Not on file  . Frequency of Social Gatherings with Friends and Family: Not on file  . Attends Religious Services: Not on file  . Active Member of Clubs or Organizations: Not on file  . Attends Banker Meetings: Not on file  . Marital Status: Not on file  Intimate Partner Violence:   . Fear of Current or Ex-Partner: Not on file  . Emotionally Abused: Not on file  . Physically Abused: Not on file  . Sexually Abused: Not on file    Outpatient Medications Prior to Visit  Medication Sig Dispense Refill  . omeprazole (PRILOSEC) 40 MG capsule Take 1 capsule (40 mg total) by mouth daily. 90 capsule 2  . montelukast (SINGULAIR) 10 MG tablet Take 1 tablet (10 mg total) by mouth at bedtime. (Patient not taking: Reported  on 01/11/2020) 30 tablet 3   No facility-administered medications prior to visit.    Allergies  Allergen Reactions  . Penicillins     Unknown reaction    ROS Review of Systems  Constitutional: Negative for chills and fever.  HENT: Positive for ear discharge and ear pain. Negative for hearing loss and sore throat.   Neurological: Negative for dizziness.      Objective:    Physical Exam Vitals reviewed.  Constitutional:      Appearance: Normal appearance.  HENT:     Ears:     Comments: Left ear canal and eardrum are normal.  Right canal is slightly swollen and mildly erythematous.  There is some soft yellow-Stopa wax and we moved as much as possible with curette.  Eardrum not fully visualized. Cardiovascular:     Rate and Rhythm: Normal rate and regular rhythm.  Pulmonary:     Effort: Pulmonary effort is normal.     Breath sounds: Normal breath sounds.  Neurological:     Mental Status: He is alert.      BP 122/78 (BP Location: Left Arm, Patient Position: Sitting, Cuff Size: Normal)   Pulse 91   Temp 98.5 F (36.9 C) (Oral)   Wt 283 lb 1.6 oz (128.4 kg)   SpO2 98%   BMI 35.39 kg/m  Wt Readings from Last 3 Encounters:  05/17/20 283 lb 1.6 oz (128.4 kg)  03/11/20 276 lb 8 oz (125.4 kg)  01/11/20 271 lb (122.9 kg)     Health Maintenance Due  Topic Date Due  . Hepatitis C Screening  Never done  . COVID-19 Vaccine (1) Never done    There are no preventive care reminders to display for this patient.  Lab Results  Component Value Date   TSH 2.08 04/07/2012   Lab Results  Component Value Date   WBC 5.4 12/26/2019   HGB 14.8 12/26/2019   HCT 43.5 12/26/2019   MCV 85.5 12/26/2019   PLT 207.0 12/26/2019   Lab Results  Component Value Date   NA 137 11/21/2019   K 4.4 11/21/2019   CO2 30 11/21/2019   GLUCOSE 89 11/21/2019   BUN 18 11/21/2019   CREATININE 0.93 11/21/2019   BILITOT 1.2 11/21/2019   ALKPHOS 68 11/21/2019   AST 20 11/21/2019   ALT 23 11/21/2019   PROT 7.2 11/21/2019   ALBUMIN 4.9 11/21/2019   CALCIUM 10.0 11/21/2019   GFR 90.59 11/21/2019   Lab Results  Component Value Date   CHOL 197 04/07/2012   Lab Results  Component Value Date   HDL 66.70 04/07/2012   Lab Results  Component Value Date   LDLCALC 109 (H) 04/07/2012   Lab Results  Component Value Date   TRIG 106.0 04/07/2012   Lab Results  Component Value Date   CHOLHDL 3 04/07/2012   No results found for: HGBA1C    Assessment & Plan:   Right otalgia.  He has evidence for some early otitis externa.  Eardrum not fully visualized.  -Keep water out of ear. -Ciprodex 4 drops right ear twice daily.  Touch base by next week if not improving  Meds ordered this encounter  Medications  . DISCONTD: ciprofloxacin-dexamethasone (CIPRODEX) OTIC suspension    Sig: Place 4 drops into the right ear 2 (two) times daily.    Dispense:  7.5 mL    Refill:  0    Follow-up: No follow-ups on  file.    Evelena Peat, MD

## 2020-05-17 NOTE — Telephone Encounter (Signed)
Pt saw Burchette today for ear pain. He believes he is having a reaction to the drops that was px. He put some drops in and an hr later it started to pulsate hurt and then it completely clogged up and now his jaw is hurting. He spoke to someone in Triage but was told it would be hrs before someone calls him back and he wants to speak to someone now..I spoke to [facility] and she stated he needs to go to UC. Pt understood and said he will go.

## 2020-05-17 NOTE — Telephone Encounter (Signed)
Medication sent in. 

## 2020-05-17 NOTE — ED Triage Notes (Signed)
Pt presents with right ear pain x 3 days. States the pain in the right ear was worse 2 hrs after he used the ciprofloxacin and dexamethasone ear drops, prescribed by his PCP office today.  Denies fever, chills or any other symptoms.

## 2020-05-17 NOTE — ED Provider Notes (Signed)
Darius Stone    CSN: [id number] Arrival date & time: 05/17/20  1709      History   Chief Complaint Chief Complaint  Patient presents with  . Otalgia    HPI Darius Stone is a 39 y.o. male.   Patient presents with right ear pain x 3 days.  He was seen by his PCP today; diagnosed with otitis externa and started on Ciprodex.  He used the eardrops x1 dose this afternoon.  He had increased pain 2 hours later.  He denies fever, chills, sore throat, cough, shortness of breath, abdominal pain, vomiting, diarrhea, or other symptoms.  The history is provided by the patient.    Past Medical History:  Diagnosis Date  . Panic attack     Patient Active Problem List   Diagnosis Date Noted  . GERD (gastroesophageal reflux disease) 03/11/2020  . Class 1 obesity with body mass index (BMI) of 34.0 to 34.9 in adult 03/11/2020  . Allergic rhinitis 10/13/2017  . Migraine headache 02/13/2014  . Routine general medical examination at a health care facility 04/07/2012  . BACK PAIN, THORACIC REGION 05/06/2009  . DISC DISEASE, LUMBAR 03/05/2009  . GENERALIZED ANXIETY DISORDER 01/11/2008    Past Surgical History:  Procedure Laterality Date  . HERNIA REPAIR    . TONSILLECTOMY    . TOTAL HIP ARTHROPLASTY Left 07/22/2018  . TYMPANOPLASTY WITH GRAFT Right    per Dr. Narda Bonds   . TYMPANOSTOMY TUBE PLACEMENT         Home Medications    Prior to Admission medications   Medication Sig Start Date End Date Taking? Authorizing Provider  ciprofloxacin-dexamethasone (CIPRODEX) OTIC suspension Place 4 drops into the right ear 2 (two) times daily. 05/17/20   Burchette, Elberta Fortis, MD  montelukast (SINGULAIR) 10 MG tablet Take 1 tablet (10 mg total) by mouth at bedtime. Patient not taking: Reported on 01/11/2020 12/27/19   [place], Betty G, MD  omeprazole (PRILOSEC) 40 MG capsule Take 1 capsule (40 mg total) by mouth daily. 03/11/20   [place], Betty G, MD    Family History Family History    Problem Relation Age of Onset  . Alcohol abuse Father   . Cancer Sister        melanoma- scalp    Social History Social History   Tobacco Use  . Smoking status: Former Games developer  . Smokeless tobacco: Never Used  Substance Use Topics  . Alcohol use: Yes  . Drug use: Yes     Allergies   Penicillins   Review of Systems Review of Systems  Constitutional: Negative for chills and fever.  HENT: Positive for ear pain. Negative for sore throat.   Eyes: Negative for pain and visual disturbance.  Respiratory: Negative for cough and shortness of breath.   Cardiovascular: Negative for chest pain and palpitations.  Gastrointestinal: Negative for abdominal pain and vomiting.  Genitourinary: Negative for dysuria and hematuria.  Musculoskeletal: Negative for arthralgias and back pain.  Skin: Negative for color change and rash.  Neurological: Negative for seizures and syncope.  All other systems reviewed and are negative.    Physical Exam Triage Vital Signs ED Triage Vitals  Enc Vitals Group     BP 05/17/20 1721 134/89     Pulse Rate 05/17/20 1721 87     Resp 05/17/20 1721 20     Temp 05/17/20 1721 98.5 F (36.9 C)     Temp Source 05/17/20 1721 Oral     SpO2 05/17/20 1721  96 %     Weight --      Height --      Head Circumference --      Peak Flow --      Pain Score 05/17/20 1719 5     Pain Loc --      Pain Edu? --      Excl. in GC? --    No data found.  Updated Vital Signs BP 134/89 (BP Location: Left Arm)   Pulse 87   Temp 98.5 F (36.9 C) (Oral)   Resp 20   SpO2 96%   Visual Acuity Right Eye Distance:   Left Eye Distance:   Bilateral Distance:    Right Eye Near:   Left Eye Near:    Bilateral Near:     Physical Exam Vitals and nursing note reviewed.  Constitutional:      General: He is not in acute distress.    Appearance: He is well-developed. He is not ill-appearing.  HENT:     Head: Normocephalic and atraumatic.     Right Ear: Drainage and swelling  present.     Left Ear: Tympanic membrane and ear canal normal.     Ears:     Comments: Limited view of right TM due to drainage and swelling in canal.    Nose: Nose normal.     Mouth/Throat:     Mouth: Mucous membranes are moist.     Pharynx: Oropharynx is clear.  Eyes:     Conjunctiva/sclera: Conjunctivae normal.  Cardiovascular:     Rate and Rhythm: Normal rate and regular rhythm.     Heart sounds: No murmur heard.   Pulmonary:     Effort: Pulmonary effort is normal. No respiratory distress.     Breath sounds: Normal breath sounds.  Abdominal:     Palpations: Abdomen is soft.     Tenderness: There is no abdominal tenderness. There is no guarding or rebound.  Musculoskeletal:     Cervical back: Neck supple.  Skin:    General: Skin is warm and dry.     Findings: No rash.  Neurological:     General: No focal deficit present.     Mental Status: He is alert and oriented to person, place, and time.     Gait: Gait normal.  Psychiatric:        Mood and Affect: Mood normal.        Behavior: Behavior normal.      UC Treatments / Results  Labs (all labs ordered are listed, but only abnormal results are displayed) Labs Reviewed - No data to display  EKG   Radiology No results found.  Procedures Procedures (including critical care time)  Medications Ordered in UC Medications - No data to display  Initial Impression / Assessment and Plan / UC Course  I have reviewed the triage vital signs and the nursing notes.  Pertinent labs & imaging results that were available during my care of the patient were reviewed by me and considered in my medical decision making (see chart for details).   Acute otitis externa of the right ear.  Instructed patient to continue the Ciprodex eardrops prescribed by his PCP and take ibuprofen as needed for discomfort.  Instructed him to follow-up with his PCP if his symptoms are not improving.  Patient agrees to plan of care.   Final Clinical  Impressions(s) / UC Diagnoses   Final diagnoses:  Acute otitis externa of right ear, unspecified type  Discharge Instructions     Continue the Ciprodex eardrops as directed.    Follow up with your primary care provider if your symptoms are not improving.       ED Prescriptions    None     PDMP not reviewed this encounter.   Mickie Bail, NP 05/17/20 [phone]

## 2020-05-28 ENCOUNTER — Encounter: Payer: Self-pay | Admitting: Family Medicine

## 2020-05-28 ENCOUNTER — Telehealth: Payer: Self-pay | Admitting: Family Medicine

## 2020-05-28 ENCOUNTER — Ambulatory Visit: Payer: Managed Care, Other (non HMO) | Admitting: Family Medicine

## 2020-05-28 ENCOUNTER — Other Ambulatory Visit: Payer: Self-pay

## 2020-05-28 VITALS — BP 120/100 | HR 69 | Temp 97.9°F | Ht 75.0 in | Wt 279.0 lb

## 2020-05-28 DIAGNOSIS — H66001 Acute suppurative otitis media without spontaneous rupture of ear drum, right ear: Secondary | ICD-10-CM | POA: Diagnosis not present

## 2020-05-28 MED ORDER — DOXYCYCLINE HYCLATE 100 MG PO CAPS: 100.0000 mg | ORAL_CAPSULE | Freq: Two times a day (BID) | ORAL | 0 refills | Status: DC

## 2020-05-28 NOTE — Telephone Encounter (Signed)
Pt would like a call back to discuss the medication he was using for his ear infection. He thinks it's affecting him by making him cold. He has an appointment with Dr. [place] on 09/01  Please call him at [phone]

## 2020-05-28 NOTE — Progress Notes (Signed)
Established Patient Office Visit  Subjective:  Patient ID: Darius Stone, male    DOB: May 28, 1981  Age: 39 y.o. MRN: [id number]  CC:  Chief Complaint  Patient presents with   Ear Pain   Chills    HPI [name] presents for follow-up for some recent right ear pain.  Refer to previous note.  He had been at [employer] and had been in some swimming.  He had evidence for some otitis externa on the right side.  He had some debris in the canal we did not have a catheter tip in the office to suction this.  We did our best to clear with a curette but he had some debris up against the eardrum.  We started Ciprodex drops.  After using drops couple times he felt the pain was even worse.  He went to urgent care and was told to continue with the same drops.  His ear feels somewhat better now but he still has some mild pain.  He had episode of chills yesterday but no documented fever.  He is not seeing any significant drainage from the canal.  No hearing loss.  Past Medical History:  Diagnosis Date   Panic attack     Past Surgical History:  Procedure Laterality Date   HERNIA REPAIR     TONSILLECTOMY     TOTAL HIP ARTHROPLASTY Left 07/22/2018   TYMPANOPLASTY WITH GRAFT Right    per Dr. Narda Bonds    TYMPANOSTOMY TUBE PLACEMENT      Family History  Problem Relation Age of Onset   Alcohol abuse Father    Cancer Sister        melanoma- scalp    Social History   Socioeconomic History   Marital status: Married    Spouse name: Not on file   Number of children: Not on file   Years of education: Not on file   Highest education level: Not on file  Occupational History   Not on file  Tobacco Use   Smoking status: Former Smoker   Smokeless tobacco: Never Used  Substance and Sexual Activity   Alcohol use: Yes   Drug use: Yes   Sexual activity: Not on file  Other Topics Concern   Not on file  Social History Narrative   Not on file   Social Determinants of  Health   Financial Resource Strain:    Difficulty of Paying Living Expenses: Not on file  Food Insecurity:    Worried About Programme researcher, broadcasting/film/video in the Last Year: Not on file   [employer] of Food in the Last Year: Not on file  Transportation Needs:    Lack of Transportation (Medical): Not on file   Lack of Transportation (Non-Medical): Not on file  Physical Activity:    Days of Exercise per Week: Not on file   Minutes of Exercise per Session: Not on file  Stress:    Feeling of Stress : Not on file  Social Connections:    Frequency of Communication with Friends and Family: Not on file   Frequency of Social Gatherings with Friends and Family: Not on file   Attends Religious Services: Not on file   Active Member of Clubs or Organizations: Not on file   Attends Banker Meetings: Not on file   Marital Status: Not on file  Intimate Partner Violence:    Fear of Current or Ex-Partner: Not on file   Emotionally Abused: Not on file  Physically Abused: Not on file   Sexually Abused: Not on file    Outpatient Medications Prior to Visit  Medication Sig Dispense Refill   ciprofloxacin-dexamethasone (CIPRODEX) OTIC suspension Place 4 drops into the right ear 2 (two) times daily. 7.5 mL 0   montelukast (SINGULAIR) 10 MG tablet Take 1 tablet (10 mg total) by mouth at bedtime. 30 tablet 3   omeprazole (PRILOSEC) 40 MG capsule Take 1 capsule (40 mg total) by mouth daily. 90 capsule 2   No facility-administered medications prior to visit.    Allergies  Allergen Reactions   Penicillins     Unknown reaction    ROS Review of Systems  Constitutional: Negative for chills and fever.  HENT: Positive for ear pain. Negative for hearing loss.   Neurological: Negative for dizziness.      Objective:    Physical Exam Vitals reviewed.  Constitutional:      Appearance: Normal appearance.  HENT:     Ears:     Comments: Left ear canal and eardrum are normal.   Right canal reveals some whitish colored debris and we were able to remove a good portion of this with a curette.  About 60 to 70% of the eardrum is visualized and there is no evidence for visible perforation.  He does have some significant erythema of the eardrum. Neurological:     Mental Status: He is alert.     BP (!) 120/100    Pulse 69    Temp 97.9 F (36.6 C) (Oral)    Ht 6\' 3"  (1.905 m)    Wt 279 lb (126.6 kg)    SpO2 96%    BMI 34.87 kg/m  Wt Readings from Last 3 Encounters:  05/28/20 279 lb (126.6 kg)  05/17/20 283 lb 1.6 oz (128.4 kg)  03/11/20 276 lb 8 oz (125.4 kg)     Health Maintenance Due  Topic Date Due   Hepatitis C Screening  Never done   COVID-19 Vaccine (1) Never done    There are no preventive care reminders to display for this patient.  Lab Results  Component Value Date   TSH 2.08 04/07/2012   Lab Results  Component Value Date   WBC 5.4 12/26/2019   HGB 14.8 12/26/2019   HCT 43.5 12/26/2019   MCV 85.5 12/26/2019   PLT 207.0 12/26/2019   Lab Results  Component Value Date   NA 137 11/21/2019   K 4.4 11/21/2019   CO2 30 11/21/2019   GLUCOSE 89 11/21/2019   BUN 18 11/21/2019   CREATININE 0.93 11/21/2019   BILITOT 1.2 11/21/2019   ALKPHOS 68 11/21/2019   AST 20 11/21/2019   ALT 23 11/21/2019   PROT 7.2 11/21/2019   ALBUMIN 4.9 11/21/2019   CALCIUM 10.0 11/21/2019   GFR 90.59 11/21/2019   Lab Results  Component Value Date   CHOL 197 04/07/2012   Lab Results  Component Value Date   HDL 66.70 04/07/2012   Lab Results  Component Value Date   LDLCALC 109 (H) 04/07/2012   Lab Results  Component Value Date   TRIG 106.0 04/07/2012   Lab Results  Component Value Date   CHOLHDL 3 04/07/2012   No results found for: HGBA1C    Assessment & Plan:   Recent right otitis externa which appears to be improving.  He has evidence for probable right otitis media as well.  -Finish out Ciprodex drops -Add doxycycline 100 mg twice daily for  10 days.  He is  penicillin allergic. -Follow-up with primary if symptoms not fully resolved in the next 10 days to 2 weeks -Keep ear dry as possible  Meds ordered this encounter  Medications   doxycycline (VIBRAMYCIN) 100 MG capsule    Sig: Take 1 capsule (100 mg total) by mouth 2 (two) times daily.    Dispense:  20 capsule    Refill:  0    Follow-up: No follow-ups on file.    Evelena Peat, MD

## 2020-05-28 NOTE — Telephone Encounter (Signed)
I spoke with patient. He has been having chills on and off for a few days. No fever or other covid symptoms. Ear pain/infection symptoms are back. Offered pt a appointment with Dr. Caryl Never at 1:30 since PCP is full. Pt accepted appointment, appointment has been booked.

## 2020-05-28 NOTE — Patient Instructions (Signed)
Try to keep ear as dry as possible    Let us know if ear symptoms not further cleared in one week.

## 2020-05-29 ENCOUNTER — Ambulatory Visit: Payer: Managed Care, Other (non HMO) | Admitting: Family Medicine

## 2020-07-01 ENCOUNTER — Telehealth: Payer: Self-pay

## 2020-07-01 ENCOUNTER — Encounter: Payer: Self-pay | Admitting: Family Medicine

## 2020-07-01 ENCOUNTER — Other Ambulatory Visit: Payer: Self-pay

## 2020-07-01 ENCOUNTER — Telehealth (INDEPENDENT_AMBULATORY_CARE_PROVIDER_SITE_OTHER): Payer: Managed Care, Other (non HMO) | Admitting: Family Medicine

## 2020-07-01 ENCOUNTER — Other Ambulatory Visit: Payer: Managed Care, Other (non HMO)

## 2020-07-01 VITALS — Ht 75.0 in

## 2020-07-01 DIAGNOSIS — F419 Anxiety disorder, unspecified: Secondary | ICD-10-CM | POA: Diagnosis not present

## 2020-07-01 DIAGNOSIS — Z1159 Encounter for screening for other viral diseases: Secondary | ICD-10-CM

## 2020-07-01 DIAGNOSIS — R6883 Chills (without fever): Secondary | ICD-10-CM | POA: Diagnosis not present

## 2020-07-01 NOTE — Telephone Encounter (Signed)
Can you schedule pt a lab appt for today? Thank you!

## 2020-07-01 NOTE — Progress Notes (Signed)
Virtual Visit via Video Note I connected with Darius Stone on 07/01/20 by a video enabled telemedicine application and verified that I am speaking with the correct person using two identifiers.  Location patient: home Location provider:work office Persons participating in the virtual visit: patient, provider  I discussed the limitations of evaluation and management by telemedicine and the availability of in person appointments. The patient expressed understanding and agreed to proceed.  Chief Complaint  Patient presents with  . Chills   HPI: Darius Stone is a 39 year old male with history of seasonal allergies, GERD, and anxiety complaining of a month of feeling chills. Feeling "cold all the time",frustrated. Problem happens when he is at home and at work.  He has not noted fever, changes in appetite, abnormal weight loss/gain, sore throat, cough, wheezing, palpitation, dyspnea, abdominal pain, changes in bowel habits, N/V, urinary symptoms, skin rash, or arthralgias. He had mild headache this morning but resolved with taking Tylenol. He feels better when he is under the sun.  He has been under some stress. For the past 1 to 2 weeks he has had night sweats, he is not sure if this is happening because he is sleeping in the guest room. Hx of panic attacks in the past but he does not feel anxious at this time.  No recent travel or insect bites. He just completed 2 weeks of quarantine because 2 family members had COVID-19 infection. Has eaten take out for a couple weeks.  ROS: See pertinent positives and negatives per HPI.  Past Medical History:  Diagnosis Date  . Panic attack    Past Surgical History:  Procedure Laterality Date  . HERNIA REPAIR    . TONSILLECTOMY    . TOTAL HIP ARTHROPLASTY Left 07/22/2018  . TYMPANOPLASTY WITH GRAFT Right    per Dr. Narda Bonds   . TYMPANOSTOMY TUBE PLACEMENT     Family History  Problem Relation Age of Onset  . Alcohol abuse Father   . Cancer  Sister        melanoma- scalp   Social History   Socioeconomic History  . Marital status: Married    Spouse name: Not on file  . Number of children: Not on file  . Years of education: Not on file  . Highest education level: Not on file  Occupational History  . Not on file  Tobacco Use  . Smoking status: Former Games developer  . Smokeless tobacco: Never Used  Substance and Sexual Activity  . Alcohol use: Yes  . Drug use: Yes  . Sexual activity: Not on file  Other Topics Concern  . Not on file  Social History Narrative  . Not on file   Social Determinants of Health   Financial Resource Strain:   . Difficulty of Paying Living Expenses: Not on file  Food Insecurity:   . Worried About Programme researcher, broadcasting/film/video in the Last Year: Not on file  . Ran Out of Food in the Last Year: Not on file  Transportation Needs:   . Lack of Transportation (Medical): Not on file  . Lack of Transportation (Non-Medical): Not on file  Physical Activity:   . Days of Exercise per Week: Not on file  . Minutes of Exercise per Session: Not on file  Stress:   . Feeling of Stress : Not on file  Social Connections:   . Frequency of Communication with Friends and Family: Not on file  . Frequency of Social Gatherings with Friends and Family: Not on file  .  Attends Religious Services: Not on file  . Active Member of Clubs or Organizations: Not on file  . Attends Banker Meetings: Not on file  . Marital Status: Not on file  Intimate Partner Violence:   . Fear of Current or Ex-Partner: Not on file  . Emotionally Abused: Not on file  . Physically Abused: Not on file  . Sexually Abused: Not on file    Current Outpatient Medications:  .  montelukast (SINGULAIR) 10 MG tablet, Take 1 tablet (10 mg total) by mouth at bedtime., Disp: 30 tablet, Rfl: 3 .  omeprazole (PRILOSEC) 40 MG capsule, Take 1 capsule (40 mg total) by mouth daily., Disp: 90 capsule, Rfl: 2  EXAM:  VITALS per patient if applicable:Ht  6\' 3"  (1.905 m)   BMI 34.87 kg/m   GENERAL: alert, oriented, appears well and in no acute distress  HEENT: atraumatic, conjunctiva clear, no obvious abnormalities on inspection.  NECK: normal movements of the head and neck  LUNGS: on inspection no signs of respiratory distress, breathing rate appears normal, no obvious gross SOB, gasping or wheezing  CV: no obvious cyanosis  MS: moves all visible extremities without noticeable abnormality  PSYCH/NEURO: pleasant and cooperative, no obvious depression, speech and thought processing grossly intact  ASSESSMENT AND PLAN:  Discussed the following assessment and plan: Orders Placed This Encounter  Procedures  . CBC  . BASIC METABOLIC PANEL WITH GFR  . Hepatitis C antibody  . TSH  . Sedimentation rate  . C-reactive protein    Chills (without fever)  Not sure about etiology, we discussed possible causes. No other associated symptom,which is reassuring. Explained that we can rule out serious process, if labs are negative, he can continue monitoring for new symptoms.  Anxiety disorder, unspecified type Chills is not the typical presentation of anxiety but if blood work is negative ,no new symptoms,it is something to consider.  Encounter for HCV screening test for low risk patient - Hepatitis C antibody; Future  I discussed the assessment and treatment plan with the patient. Darius Stone was provided an opportunity to ask questions and all were answered. He agreed with the plan and demonstrated an understanding of the instructions.  No follow-ups on file.   Veria Stradley Cliffton Asters, MD

## 2020-07-01 NOTE — Telephone Encounter (Signed)
LMVM for the patient to contact to the office to schedule for labs today per Dr. Swaziland.

## 2020-07-02 LAB — CBC
HCT: 46.6 % (ref 38.5–50.0)
Hemoglobin: 15.5 g/dL (ref 13.2–17.1)
MCH: 28.2 pg (ref 27.0–33.0)
MCHC: 33.3 g/dL (ref 32.0–36.0)
MCV: 84.7 fL (ref 80.0–100.0)
MPV: 11.1 fL (ref 7.5–12.5)
Platelets: 166 10*3/uL (ref 140–400)
RBC: 5.5 10*6/uL (ref 4.20–5.80)
RDW: 13.1 % (ref 11.0–15.0)
WBC: 3 10*3/uL — ABNORMAL LOW (ref 3.8–10.8)

## 2020-07-02 LAB — BASIC METABOLIC PANEL WITH GFR
BUN: 19 mg/dL (ref 7–25)
CO2: 27 mmol/L (ref 20–32)
Calcium: 9.2 mg/dL (ref 8.6–10.3)
Chloride: 102 mmol/L (ref 98–110)
Creat: 1.13 mg/dL (ref 0.60–1.35)
GFR, Est African American: 94 mL/min/{1.73_m2} (ref >=60)
GFR, Est Non African American: 81 mL/min/{1.73_m2} (ref >=60)
Glucose, Bld: 89 mg/dL (ref 65–99)
Potassium: 3.9 mmol/L (ref 3.5–5.3)
Sodium: 139 mmol/L (ref 135–146)

## 2020-07-02 LAB — HEPATITIS C ANTIBODY
Hepatitis C Ab: NONREACTIVE
SIGNAL TO CUT-OFF: 0.01 (ref <1.00)

## 2020-07-02 LAB — C-REACTIVE PROTEIN: CRP: 4.7 mg/L (ref <8.0)

## 2020-07-02 LAB — TSH: TSH: 0.89 mIU/L (ref 0.40–4.50)

## 2020-07-02 LAB — SEDIMENTATION RATE: Sed Rate: 6 mm/h (ref 0–15)

## 2021-02-04 ENCOUNTER — Other Ambulatory Visit: Payer: Self-pay

## 2021-02-05 ENCOUNTER — Ambulatory Visit: Payer: Managed Care, Other (non HMO) | Admitting: Family Medicine

## 2021-05-18 ENCOUNTER — Other Ambulatory Visit: Payer: Self-pay | Admitting: Family Medicine

## 2021-07-21 ENCOUNTER — Ambulatory Visit: Payer: Managed Care, Other (non HMO) | Admitting: Family Medicine

## 2021-07-21 ENCOUNTER — Other Ambulatory Visit: Payer: Self-pay

## 2021-07-21 VITALS — HR 78 | Temp 97.9°F | Wt 281.4 lb

## 2021-07-21 DIAGNOSIS — L237 Allergic contact dermatitis due to plants, except food: Secondary | ICD-10-CM

## 2021-07-21 MED ORDER — METHYLPREDNISOLONE ACETATE 80 MG/ML IJ SUSP
80.0000 mg | Freq: Once | INTRAMUSCULAR | Status: AC
  Administered 2021-07-21: 80 mg via INTRAMUSCULAR

## 2021-07-21 NOTE — Progress Notes (Signed)
Established Patient Office Visit  Subjective:  Patient ID: Darius Stone, male    DOB: January 16, 1981  Age: 39 y.o. MRN: [id number]  CC:  Chief Complaint  Patient presents with   Poison Ivy    X 2 days, arms, stomach, neck and face, spreading quickly    HPI Darius Stone presents for possible poison ivy rash past couple days.  He had recently taken down a couple of trees for his mother.  He noticed some vines going up the trees.  He has had contact allergy with poison ivy similarly in the past.  He noticed couple days ago some pruritic rash involving both arms, neck, anterior trunk, stomach.  Moderate pruritus.  Tried over-the-counter topical steroid without improvement.  Has fairly severe pruritus.  Worse with heat.  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  He has been told he would likely have to have his right hip replaced eventually.  Past Medical History:  Diagnosis Date   Panic attack     Past Surgical History:  Procedure Laterality Date   HERNIA REPAIR     TONSILLECTOMY     TOTAL HIP ARTHROPLASTY Left 07/22/2018   TYMPANOPLASTY WITH GRAFT Right    per Dr. Narda Bonds    TYMPANOSTOMY TUBE PLACEMENT      Family History  Problem Relation Age of Onset   Alcohol abuse Father    Cancer Sister        melanoma- scalp    Social History   Socioeconomic History   Marital status: Married    Spouse name: Not on file   Number of children: Not on file   Years of education: Not on file   Highest education level: Not on file  Occupational History   Not on file  Tobacco Use   Smoking status: Former   Smokeless tobacco: Never  Substance and Sexual Activity   Alcohol use: Yes   Drug use: Yes   Sexual activity: Not on file  Other Topics Concern   Not on file  Social History Narrative   Not on file   Social Determinants of Health   Financial Resource Strain: Not on file  Food Insecurity: Not on file  Transportation Needs: Not on  file  Physical Activity: Not on file  Stress: Not on file  Social Connections: Not on file  Intimate Partner Violence: Not on file    Outpatient Medications Prior to Visit  Medication Sig Dispense Refill   montelukast (SINGULAIR) 10 MG tablet Take 1 tablet (10 mg total) by mouth at bedtime. 30 tablet 3   omeprazole (PRILOSEC) 40 MG capsule Take 1 capsule by mouth once daily 90 capsule 0   No facility-administered medications prior to visit.    Allergies  Allergen Reactions   Penicillins     Unknown reaction    ROS Review of Systems  Constitutional:  Negative for chills and fever.  Skin:  Positive for rash.     Objective:    Physical Exam Vitals reviewed.  Constitutional:      Appearance: Normal appearance.  Cardiovascular:     Rate and Rhythm: Normal rate and regular rhythm.  Pulmonary:     Effort: Pulmonary effort is normal.     Breath sounds: Normal breath sounds.  Skin:    Findings: Rash present.     Comments: Vesicular rash in several patches including face, bilaterally anterior upper chest, neck, arms bilaterally  Neurological:  Mental Status: He is alert.    Pulse 78   Temp 97.9 F (36.6 C) (Oral)   Wt 281 lb 6.4 oz (127.6 kg)   SpO2 98%   BMI 35.17 kg/m  Wt Readings from Last 3 Encounters:  07/21/21 281 lb 6.4 oz (127.6 kg)  05/28/20 279 lb (126.6 kg)  05/17/20 283 lb 1.6 oz (128.4 kg)     Health Maintenance Due  Topic Date Due   COVID-19 Vaccine (1) Never done   HIV Screening  Never done   INFLUENZA VACCINE  Never done    There are no preventive care reminders to display for this patient.  Lab Results  Component Value Date   TSH 0.89 07/01/2020   Lab Results  Component Value Date   WBC 3.0 (L) 07/01/2020   HGB 15.5 07/01/2020   HCT 46.6 07/01/2020   MCV 84.7 07/01/2020   PLT 166 07/01/2020   Lab Results  Component Value Date   NA 139 07/01/2020   K 3.9 07/01/2020   CO2 27 07/01/2020   GLUCOSE 89 07/01/2020   BUN 19  07/01/2020   CREATININE 1.13 07/01/2020   BILITOT 1.2 11/21/2019   ALKPHOS 68 11/21/2019   AST 20 11/21/2019   ALT 23 11/21/2019   PROT 7.2 11/21/2019   ALBUMIN 4.9 11/21/2019   CALCIUM 9.2 07/01/2020   GFR 90.59 11/21/2019   Lab Results  Component Value Date   CHOL 197 04/07/2012   Lab Results  Component Value Date   HDL 66.70 04/07/2012   Lab Results  Component Value Date   LDLCALC 109 (H) 04/07/2012   Lab Results  Component Value Date   TRIG 106.0 04/07/2012   Lab Results  Component Value Date   CHOLHDL 3 04/07/2012   No results found for: HGBA1C    Assessment & Plan:   Contact dermatitis with widespread involvement.  No response with topical cortisone cream.  We discussed giving Depo-Medrol 80 mg IM.  We also discussed it would be ideal to avoid any steroids with his history but with severe widespread involvement will be very difficult to treat without any kind of steroid.  No orders of the defined types were placed in this encounter.   Follow-up: No follow-ups on file.    Evelena Peat, MD

## 2021-07-21 NOTE — Addendum Note (Signed)
Addended by: Solon Augusta on: 07/21/2021 04:13 PM   Modules accepted: Orders

## 2021-07-23 ENCOUNTER — Telehealth: Payer: Self-pay | Admitting: Family Medicine

## 2021-07-23 MED ORDER — PREDNISONE 10 MG PO TABS: ORAL_TABLET | ORAL | 0 refills | Status: DC

## 2021-07-23 NOTE — Telephone Encounter (Signed)
Pt seen dr Caryl Never on 07-21-2021 for poison ivy and was given a shot. Pt is calling and he is no better and per pt md told him to call him if he is not better and he would prescribe some steroid pills.  Pt will like a call back [id number] - [place], [place] - [address] [phone]

## 2021-07-23 NOTE — Telephone Encounter (Signed)
Rx sent in. Spoke with the patient, he is aware the prescription was sent to your pharmacy.

## 2021-09-08 ENCOUNTER — Other Ambulatory Visit: Payer: Self-pay | Admitting: Family Medicine

## 2021-10-18 ENCOUNTER — Other Ambulatory Visit: Payer: Self-pay | Admitting: Family Medicine

## 2021-10-29 NOTE — Progress Notes (Deleted)
° ° °  HPI:  [name] is a 41 y.o. male, who is here today to follow on medication.  Review of Systems Rest see pertinent positives and negatives per HPI.  Current Outpatient Medications on File Prior to Visit  Medication Sig Dispense Refill   montelukast (SINGULAIR) 10 MG tablet Take 1 tablet (10 mg total) by mouth at bedtime. 30 tablet 3   omeprazole (PRILOSEC) 40 MG capsule Take 1 capsule by mouth once daily 30 capsule 0   predniSONE (DELTASONE) 10 MG tablet Take by mouth as follows 4-4-4-3-3-3-2-2-2-1-1-1 30 tablet 0   No current facility-administered medications on file prior to visit.    Past Medical History:  Diagnosis Date   Panic attack    Allergies  Allergen Reactions   Penicillins     Unknown reaction    Social History   Socioeconomic History   Marital status: Married    Spouse name: Not on file   Number of children: Not on file   Years of education: Not on file   Highest education level: Not on file  Occupational History   Not on file  Tobacco Use   Smoking status: Former   Smokeless tobacco: Never  Substance and Sexual Activity   Alcohol use: Yes   Drug use: Yes   Sexual activity: Not on file  Other Topics Concern   Not on file  Social History Narrative   Not on file   Social Determinants of Health   Financial Resource Strain: Not on file  Food Insecurity: Not on file  Transportation Needs: Not on file  Physical Activity: Not on file  Stress: Not on file  Social Connections: Not on file    There were no vitals filed for this visit. There is no height or weight on file to calculate BMI.  Physical Exam  ASSESSMENT AND PLAN:   There are no diagnoses linked to this encounter.  No orders of the defined types were placed in this encounter.   No problem-specific Assessment & Plan notes found for this encounter.   No follow-ups on file.   Betty G. Swaziland, MD  [facility]. [place]  office.

## 2021-10-31 ENCOUNTER — Telehealth: Payer: Self-pay | Admitting: Family Medicine

## 2021-10-31 ENCOUNTER — Telehealth (INDEPENDENT_AMBULATORY_CARE_PROVIDER_SITE_OTHER): Payer: Managed Care, Other (non HMO) | Admitting: Family Medicine

## 2021-10-31 ENCOUNTER — Encounter: Payer: Self-pay | Admitting: Family Medicine

## 2021-10-31 VITALS — Ht 75.0 in

## 2021-10-31 DIAGNOSIS — J069 Acute upper respiratory infection, unspecified: Secondary | ICD-10-CM | POA: Diagnosis not present

## 2021-10-31 DIAGNOSIS — K219 Gastro-esophageal reflux disease without esophagitis: Secondary | ICD-10-CM | POA: Diagnosis not present

## 2021-10-31 DIAGNOSIS — R051 Acute cough: Secondary | ICD-10-CM

## 2021-10-31 MED ORDER — OMEPRAZOLE 20 MG PO CPDR: 20.0000 mg | DELAYED_RELEASE_CAPSULE | Freq: Every day | ORAL | 0 refills | Status: DC

## 2021-10-31 MED ORDER — BENZONATATE 100 MG PO CAPS: 200.0000 mg | ORAL_CAPSULE | Freq: Two times a day (BID) | ORAL | 0 refills | Status: AC | PRN

## 2021-10-31 NOTE — Telephone Encounter (Signed)
Patient calling in with respiratory symptoms: Shortness of breath, chest pain, palpitations or other red words send to Triage  Does the patient have a fever over 100, cough, congestion, sore throat, runny nose, lost of taste/smell (please list symptoms that patient has)?ha and cough  What date did symptoms start?10-29-2021 (If over 5 days ago, pt may be scheduled for in person visit)  Have you tested for Covid in the last 5 days? No   If yes, was it positive []  OR negative [x] ? If positive in the last 5 days, please schedule virtual visit now. If negative, schedule for an in person OV with the next available provider if PCP has no openings. Please also let patient know they will be tested again (follow the script below)  "you will have to arrive prior to your appt time to be Covid tested. Please park in back of office at the cone & call [phone] to let the staff know you have arrived. A staff member will meet you at your car to do a rapid covid test. Once the test has resulted you will be notified by phone of your results to determine if appt will remain an in person visit or be converted to a virtual/phone visit. If you arrive less than before your appt time, your visit will be automatically converted to virtual & any recommended testing will happen AFTER the visit." Pt has a virtual appt with dr [phone] 10-31-2021 830am  THINGS TO REMEMBER  If no availability for virtual visit in office,  please schedule another [place] office  If no availability at another [place] office, please instruct patient that they can schedule an evisit or virtual visit through their mychart account. Visits up to 8pm  patients can be seen in office 5 days after positive COVID test

## 2021-10-31 NOTE — Progress Notes (Signed)
Virtual Visit via Video Note I connected with Darius Stone on 10/31/21 by a video enabled telemedicine application and verified that I am speaking with the correct person using two identifiers.  Location patient: home Location provider:work office Persons participating in the virtual visit: patient, provider  I discussed the limitations of evaluation and management by telemedicine and the availability of in person appointments. The patient expressed understanding and agreed to proceed.  Chief Complaint  Patient presents with   Medication Refill    Omeprazole.    Cough    X 1-2 days.   Headache    X 1-2 days.    HPI: Darius Stone is a 41 yo male with hx of anxiety,allergies, and GERD c/o respiratory symptoms as described above.  Fatigue, headache,sore throat, nasal congestion,rhinorrhea, productive cough, and "little" body aches. "Little" wheezing when he feels chest congestion and resolves after coughing. Brown-yellowish sputum, no hemoptysis. Chest wall pain when coughing. Negative for fever, anosmia,ageusia, SOB,palpitations,CP,abdominal pain,N/V,changes in bowel habits,urinary symptoms,or skin rash.  Home COVID 19 test when symptoms started, negative  He has taken Tylenol and Alka-seltzer cold.  His wife and child were Dx'd with COVID 19 infection about a week ago while he was out of town. He had 2 negative COVID 19 test before leaving town and x 1 when he came back.  He was last seen on 07/01/2020. GERD, he has been on Omeprazole 40 mg daily, ran out a few days ago. Mid chest discomfort after eating x 1, no associated symptoms. He wonders if he needs to continue medication.  Negative for heartburn,dysphagia,blood in stool, or melena.  ROS: See pertinent positives and negatives per HPI.  Past Medical History:  Diagnosis Date   Panic attack    Past Surgical History:  Procedure Laterality Date   HERNIA REPAIR     TONSILLECTOMY     TOTAL HIP ARTHROPLASTY Left 07/22/2018    TYMPANOPLASTY WITH GRAFT Right    per Dr. Narda Bonds    TYMPANOSTOMY TUBE PLACEMENT     Family History  Problem Relation Age of Onset   Alcohol abuse Father    Cancer Sister        melanoma- scalp   Social History   Socioeconomic History   Marital status: Married    Spouse name: Not on file   Number of children: Not on file   Years of education: Not on file   Highest education level: Not on file  Occupational History   Not on file  Tobacco Use   Smoking status: Former   Smokeless tobacco: Never  Substance and Sexual Activity   Alcohol use: Yes   Drug use: Yes   Sexual activity: Not on file  Other Topics Concern   Not on file  Social History Narrative   Not on file   Social Determinants of Health   Financial Resource Strain: Not on file  Food Insecurity: Not on file  Transportation Needs: Not on file  Physical Activity: Not on file  Stress: Not on file  Social Connections: Not on file  Intimate Partner Violence: Not on file   Current Outpatient Medications:    montelukast (SINGULAIR) 10 MG tablet, Take 1 tablet (10 mg total) by mouth at bedtime., Disp: 30 tablet, Rfl: 3   omeprazole (PRILOSEC) 40 MG capsule, Take 1 capsule by mouth once daily, Disp: 30 capsule, Rfl: 0  EXAM:  VITALS per patient if applicable:Ht 6\' 3"  (1.905 m)    BMI 35.17 kg/m   GENERAL:  alert, oriented, appears well and in no acute distress  HEENT: atraumatic, conjunctiva clear, no obvious abnormalities on inspection of external nose and ears Uvula centered. I do not appreciate exudate, some pharyngeal erythema, no edema.  NECK: normal movements of the head and neck  LUNGS: on inspection no signs of respiratory distress, breathing rate appears normal, no obvious gross SOB, gasping or wheezing  CV: no obvious cyanosis  MS: moves all visible extremities without noticeable abnormality  PSYCH/NEURO: pleasant and cooperative, no obvious depression or anxiety, speech and thought processing  grossly intact  ASSESSMENT AND PLAN:  Discussed the following assessment and plan:  Gastroesophageal reflux disease, unspecified whether esophagitis present - Plan: omeprazole (PRILOSEC) 20 MG capsule Problem seems to be well controlled. Recommend decreasing omeprazole from 40 mg to 20 mg daily for a few weeks then daily prn. Once he feels better he can continue as needed. Continue GERD precautions.  URI, acute Symptoms suggests a viral etiology, so symptomatic treatment recommended. Instructed to monitor for signs of complications, including new onset of fever among some, instructed about warning signs. Offered to come to the clinic for rapid flu test, states that [facility], where rapid flu and COVID 19 test can be done. Plenty of po fluids and rest. Continue Tylenol 500 mg 3-4 times per day as needed. F/U as needed.  Acute cough - Plan: benzonatate (TESSALON) 100 MG capsule Explained that cough and nasal congestion can last a few days and sometimes weeks. Plain mucinex may help. Benzonatate recommended.  We discussed possible serious and likely etiologies, options for evaluation and workup, limitations of telemedicine visit vs in person visit, treatment, treatment risks and precautions. The patient was advised to call back or seek an in-person evaluation if the symptoms worsen or if the condition fails to improve as anticipated. I discussed the assessment and treatment plan with the patient. The patient was provided an opportunity to ask questions and all were answered. The patient agreed with the plan and demonstrated an understanding of the instructions.  Return if symptoms worsen or fail to improve.  Anavey Coombes G. Swaziland, MD  [facility]. [place] office.

## 2021-11-01 ENCOUNTER — Encounter: Payer: Self-pay | Admitting: Family Medicine

## 2022-01-11 IMAGING — DX DG CHEST 2V
1 series · 1 of 1 positions shown · non-contrast
Comparison: None.

CLINICAL DATA: Retrosternal pain common no known injury, initial
encounter

EXAM:
CHEST - 2 VIEW

[chest lat]
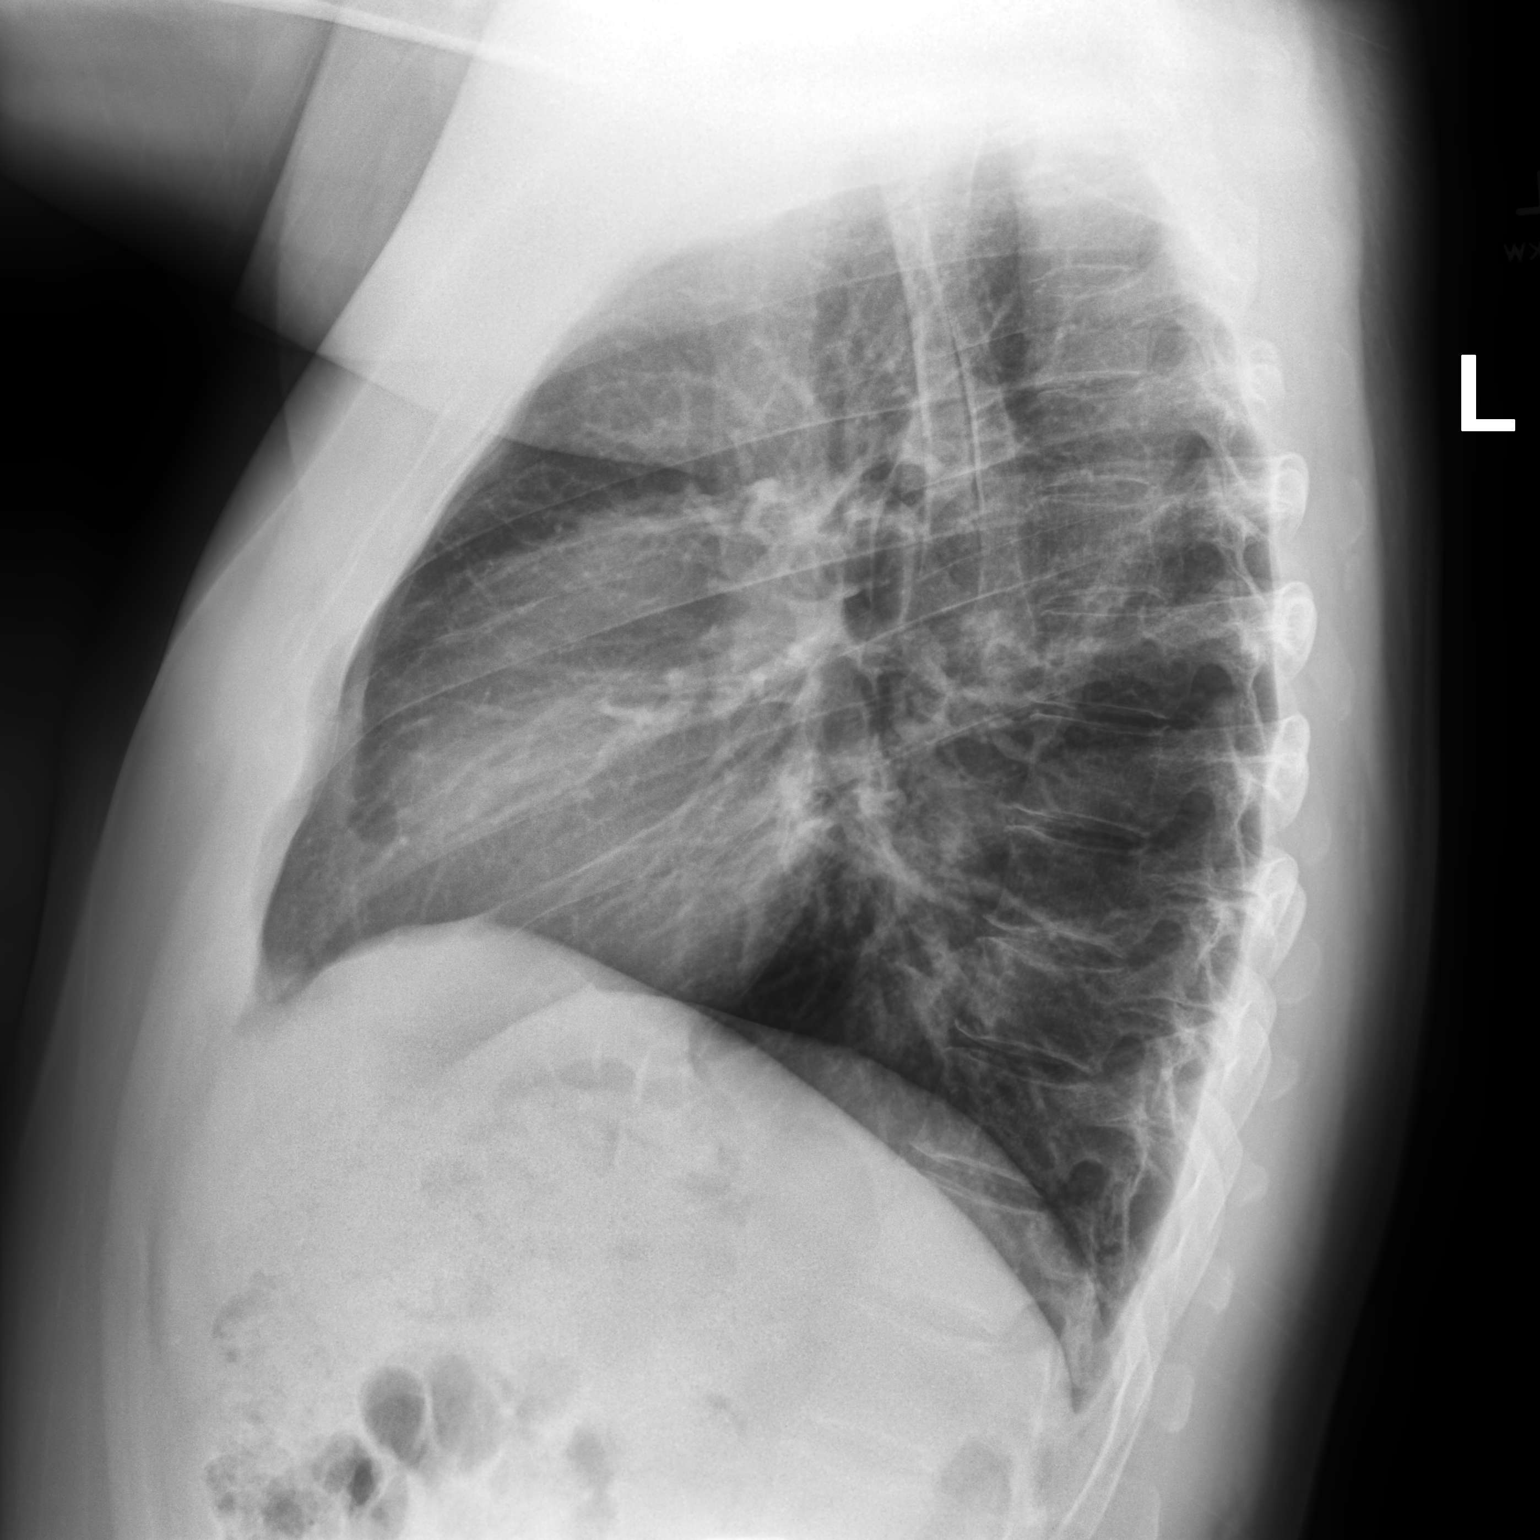

[1 of 1 positions shown; findings below may reference images not displayed]

FINDINGS: The heart size and mediastinal contours are within normal limits.
Both lungs are clear. The visualized skeletal structures are
unremarkable.
IMPRESSION: No active cardiopulmonary disease.

## 2022-04-08 ENCOUNTER — Encounter: Payer: Self-pay | Admitting: Family Medicine

## 2022-04-08 ENCOUNTER — Ambulatory Visit: Payer: Managed Care, Other (non HMO) | Admitting: Family Medicine

## 2022-04-08 VITALS — BP 128/88 | HR 72 | Temp 97.8°F | Wt 303.4 lb

## 2022-04-08 DIAGNOSIS — H6982 Other specified disorders of Eustachian tube, left ear: Secondary | ICD-10-CM

## 2022-04-08 DIAGNOSIS — H9202 Otalgia, left ear: Secondary | ICD-10-CM | POA: Diagnosis not present

## 2022-04-08 DIAGNOSIS — H65192 Other acute nonsuppurative otitis media, left ear: Secondary | ICD-10-CM

## 2022-04-08 NOTE — Patient Instructions (Signed)
Try using over-the-counter allergy medicine such as Claritin, Zyrtec, or Allegra.  You can also use Flonase nasal spray Nasacort to help with symptoms.

## 2022-04-08 NOTE — Progress Notes (Signed)
Subjective:    Patient ID: Darius Stone, male    DOB: 07/02/1981, 41 y.o.   MRN: [id number]  Chief Complaint  Patient presents with   Ear Pain    On L side. Been going on for 2 days. "Hear clicking"    HPI Patient was seen today for acute concern.  Patient endorses pain in L ear and side of neck x2 days.  Occasionally hears clicking in the ear.  Patient endorses history of seasonal allergies that have improved since moving from [place].  Not currently taking any allergy medication.  Patient endorses mild rhinorrhea in a.m.  Denies grinding teeth at night, sore throat, headache, fever, nausea, vomiting, cough, sick contacts, recent travel.  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  Past Medical History:  Diagnosis Date   Panic attack     Allergies  Allergen Reactions   Penicillins     Unknown reaction    ROS General: Denies fever, chills, night sweats, changes in weight, changes in appetite HEENT: Denies headaches, ear pain, changes in vision, rhinorrhea, sore throat  + ear and left-sided neck pain. CV: Denies CP, palpitations, SOB, orthopnea Pulm: Denies SOB, cough, wheezing GI: Denies abdominal pain, nausea, vomiting, diarrhea, constipation GU: Denies dysuria, hematuria, frequency Msk: Denies muscle cramps, joint pains Neuro: Denies weakness, numbness, tingling Skin: Denies rashes, bruising Psych: Denies depression, anxiety, hallucinations     Objective:    Blood pressure 128/88, pulse 72, temperature 97.8 F (36.6 C), temperature source Oral, weight (!) 303 lb 6.4 oz (137.6 kg), SpO2 98 %.  Gen. Pleasant, well-nourished, in no distress, normal affect   HEENT: [place]/AT, face symmetric, conjunctiva clear, no scleral icterus, PERRLA, EOMI, nares patent without drainage, pharynx without erythema or exudate.  No TTP of external ear or pain with movement of external ear.  Right TM normal.  Left TM full with effusion, no erythema. Lungs: no  accessory muscle use, CTAB, no wheezes or rales Cardiovascular: RRR, no m/r/g, no peripheral edema Neuro:  A&Ox3, CN II-XII intact, normal gait Skin:  Warm, no lesions/ rash   Wt Readings from Last 3 Encounters:  04/08/22 (!) 303 lb 6.4 oz (137.6 kg)  07/21/21 281 lb 6.4 oz (127.6 kg)  05/28/20 279 lb (126.6 kg)    Lab Results  Component Value Date   WBC 3.0 (L) 07/01/2020   HGB 15.5 07/01/2020   HCT 46.6 07/01/2020   PLT 166 07/01/2020   GLUCOSE 89 07/01/2020   CHOL 197 04/07/2012   TRIG 106.0 04/07/2012   HDL 66.70 04/07/2012   LDLCALC 109 (H) 04/07/2012   ALT 23 11/21/2019   AST 20 11/21/2019   NA 139 07/01/2020   K 3.9 07/01/2020   CL 102 07/01/2020   CREATININE 1.13 07/01/2020   BUN 19 07/01/2020   CO2 27 07/01/2020   TSH 0.89 07/01/2020    Assessment/Plan:  Dysfunction of left eustachian tube  Left ear pain  Acute effusion of left ear  Effusion of left TM noted.  Likely 2/2 eustachian tube dysfunction.  Discussed using OTC antihistamines such as Allegra, Zyrtec, Claritin or Flonase nasal spray.  Offered prescription.  Patient declined as thinks has medication at home.  Discussed massage of TMJ to help relieve pressure in ear.  Given handouts.  If unable to relieve eustachian tube dysfunction could lead to AOM.  Given precautions.  F/u as needed  Abbe Amsterdam, MD

## 2022-12-14 NOTE — Progress Notes (Unsigned)
   ACUTE VISIT No chief complaint on file.  HPI: [name] is a 42 y.o. male, who is here today complaining of *** HPI  Review of Systems See other pertinent positives and negatives in HPI.  No current outpatient medications on file prior to visit.   No current facility-administered medications on file prior to visit.    Past Medical History:  Diagnosis Date   Panic attack    Allergies  Allergen Reactions   Penicillins     Unknown reaction    Social History   Socioeconomic History   Marital status: Married    Spouse name: Not on file   Number of children: Not on file   Years of education: Not on file   Highest education level: Not on file  Occupational History   Not on file  Tobacco Use   Smoking status: Former   Smokeless tobacco: Never  Substance and Sexual Activity   Alcohol use: Yes   Drug use: Yes   Sexual activity: Not on file  Other Topics Concern   Not on file  Social History Narrative   Not on file   Social Determinants of Health   Financial Resource Strain: Not on file  Food Insecurity: Not on file  Transportation Needs: Not on file  Physical Activity: Not on file  Stress: Not on file  Social Connections: Not on file    There were no vitals filed for this visit. There is no height or weight on file to calculate BMI.  Physical Exam  ASSESSMENT AND PLAN: There are no diagnoses linked to this encounter.  No follow-ups on file.  Darius Stone G. [place], MD  [facility]. [place] office.  Discharge Instructions   None

## 2022-12-15 ENCOUNTER — Encounter: Payer: Self-pay | Admitting: Family Medicine

## 2022-12-15 ENCOUNTER — Ambulatory Visit (INDEPENDENT_AMBULATORY_CARE_PROVIDER_SITE_OTHER): Payer: Managed Care, Other (non HMO) | Admitting: Family Medicine

## 2022-12-15 VITALS — BP 128/80 | HR 76 | Resp 12 | Ht 75.0 in | Wt 298.2 lb

## 2022-12-15 DIAGNOSIS — L819 Disorder of pigmentation, unspecified: Secondary | ICD-10-CM

## 2022-12-15 DIAGNOSIS — R41840 Attention and concentration deficit: Secondary | ICD-10-CM | POA: Diagnosis not present

## 2022-12-18 ENCOUNTER — Telehealth: Payer: Self-pay | Admitting: Family Medicine

## 2022-12-18 NOTE — Telephone Encounter (Signed)
Pt stating he faxed in info regarding his ADHD to assist provider in prescribing meds. Advised patient to allow time for documents to be routed and provider to review. Requesting a call with an update once forms are received

## 2022-12-21 NOTE — Telephone Encounter (Signed)
I have not yet received copy of records at this time. Will contact pt once I review records. Illona Bulman [place], MD

## 2022-12-22 NOTE — Telephone Encounter (Signed)
I called and spoke with patient since we had not received records yet. He will bring a copy by the office on Thursday.

## 2022-12-29 NOTE — Telephone Encounter (Signed)
Patient calling to check on progress of this request. Says he dropped paperwork off last week.

## 2022-12-29 NOTE — Telephone Encounter (Signed)
On your desk

## 2023-01-01 ENCOUNTER — Telehealth: Payer: Self-pay | Admitting: Family Medicine

## 2023-01-01 ENCOUNTER — Other Ambulatory Visit: Payer: Self-pay | Admitting: Family Medicine

## 2023-01-01 DIAGNOSIS — F902 Attention-deficit hyperactivity disorder, combined type: Secondary | ICD-10-CM | POA: Insufficient documentation

## 2023-01-01 MED ORDER — METHYLPHENIDATE HCL ER (LA) 10 MG PO CP24: 10.0000 mg | ORAL_CAPSULE | Freq: Every day | ORAL | 0 refills | Status: DC

## 2023-01-01 NOTE — Telephone Encounter (Signed)
Reviewed some of the records that were brought to me. Dx ADHD. Ritalin LA prescription sent to his pharmacy. Follow-up in 3 to 4 weeks. Thanks, BJ

## 2023-01-01 NOTE — Telephone Encounter (Signed)
Pt is calling back and stated he will be waiting for call back to let him know if he can get back on the medication.

## 2023-01-01 NOTE — Telephone Encounter (Signed)
Patient informed of the message below.

## 2023-01-08 NOTE — Telephone Encounter (Signed)
error 

## 2023-02-02 NOTE — Progress Notes (Signed)
[name] is a 42 y.o.male, who is here today to follow on ADHD/ADD.  Darius Stone presents today to discuss a few concerns.  After his last visit we obtained records from psychologist at the time he was diagnosed with ADHD and Ritalin LA 10 mg was started. He finds Ritalin effective in the morning, but the effects wear off by lunchtime, so he starts with "hyperactivity" and having more difficulty with completing tasks. He tolerated medication well. Negative for headache, palpitations, abdominal pain, tremor,or tics.  He states that he stopped taking the medication after about two and a half weeks ago because noted right eye brown hair loss. Occasional pruritus on affected area, no rash. There is no hair loss on his scalp, beard, or other body areas, and no other symptoms like rash or itching elsewhere.  No associated fever, chills, arthralgias, or myalgias.  Review of Systems  Constitutional:  Negative for activity change and appetite change.  HENT:  Negative for mouth sores and sore throat.   Respiratory:  Negative for cough, shortness of breath and wheezing.   Cardiovascular:  Negative for chest pain and leg swelling.  Gastrointestinal:  Negative for nausea and vomiting.  Genitourinary:  Negative for decreased urine volume, dysuria and hematuria.  Neurological:  Negative for syncope and weakness.  See other pertinent positives and negatives in HPI.  No current outpatient medications on file prior to visit.   No current facility-administered medications on file prior to visit.   Past Medical History:  Diagnosis Date   Panic attack    Allergies  Allergen Reactions   Penicillins     Unknown reaction   Social History   Socioeconomic History   Marital status: Married    Spouse name: Not on file   Number of children: Not on file   Years of education: Not on file   Highest education level: Not on file  Occupational History   Not on file  Tobacco Use   Smoking  status: Former   Smokeless tobacco: Never  Substance and Sexual Activity   Alcohol use: Yes   Drug use: Yes   Sexual activity: Not on file  Other Topics Concern   Not on file  Social History Narrative   Not on file   Social Determinants of Health   Financial Resource Strain: Not on file  Food Insecurity: Not on file  Transportation Needs: Not on file  Physical Activity: Not on file  Stress: Not on file  Social Connections: Not on file   Vitals:   02/03/23 1554  BP: 128/80  Pulse: 87  Resp: 12  SpO2: 97%   Body mass index is 36.76 kg/m.  Physical Exam Vitals and nursing note reviewed.  Constitutional:      General: He is not in acute distress.    Appearance: He is well-developed.  HENT:     Head: Normocephalic and atraumatic.     Mouth/Throat:     Mouth: Mucous membranes are moist.  Eyes:     Conjunctiva/sclera: Conjunctivae normal.   Cardiovascular:     Rate and Rhythm: Normal rate and regular rhythm.     Heart sounds: No murmur heard. Pulmonary:     Effort: Pulmonary effort is normal. No respiratory distress.     Breath sounds: Normal breath sounds.  Abdominal:     Palpations: Abdomen is soft. There is no mass.     Tenderness: There is no abdominal tenderness.  Skin:    General: Skin is warm.  Findings: No erythema.  Neurological:     Mental Status: He is alert and oriented to person, place, and time.  Psychiatric:        Thought Content: Thought content does not include suicidal ideation.   Assessment and plan  [name] was seen today for adhd.  Diagnoses and all orders for this visit: Lab Results  Component Value Date   TSH 1.67 02/03/2023   Lab Results  Component Value Date   CRP <1.0 02/03/2023   Lab Results  Component Value Date   WBC 6.8 02/03/2023   HGB 15.4 02/03/2023   HCT 46.0 02/03/2023   MCV 85.0 02/03/2023   PLT 210.0 02/03/2023   Lab Results  Component Value Date   CREATININE 0.86 02/03/2023   BUN 15 02/03/2023    NA 139 02/03/2023   K 4.2 02/03/2023   CL 101 02/03/2023   CO2 29 02/03/2023   Lab Results  Component Value Date   ALT 29 02/03/2023   AST 23 02/03/2023   ALKPHOS 67 02/03/2023   BILITOT 1.0 02/03/2023    Attention deficit hyperactivity disorder (ADHD), combined type Assessment & Plan: Ritalin LA helped but effect did not last long. We discussed options, including increasing dose or changing to a different medication. He agrees with increasing Ritalin LA from 10 mg to 20 mg. We reviewed some side effects. Medication contract to be signed next visit, if we decide to continue treatment. F/U in 3 months.  Orders: -     Methylphenidate HCl ER (LA); Take 1 capsule (20 mg total) by mouth every morning.  Dispense: 30 capsule; Refill: 0  Alopecia ? Alopecia areata affecting right eye brow. Differential Dx discussed. I do not think it is caused by Ritalin. Recommend trying topical Triamcinolone , small amount bid for 14 days. If not resolved or gets worse, will recommend dermatologist evaluation.  -     TSH; Future -     Comprehensive metabolic panel; Future -     C-reactive protein; Future -     ANA; Future -     CBC; Future -     Triamcinolone Acetonide; Apply 1 Application topically 2 (two) times daily.  Dispense: 30 g; Refill: 1  Return in about 3 months (around 05/06/2023).  Liberty Seto G. [place], MD  [facility]. [place] office

## 2023-02-03 ENCOUNTER — Ambulatory Visit: Payer: Managed Care, Other (non HMO) | Admitting: Family Medicine

## 2023-02-03 ENCOUNTER — Encounter: Payer: Self-pay | Admitting: Family Medicine

## 2023-02-03 VITALS — BP 128/80 | HR 87 | Resp 12 | Ht 75.0 in | Wt 294.1 lb

## 2023-02-03 DIAGNOSIS — L659 Nonscarring hair loss, unspecified: Secondary | ICD-10-CM

## 2023-02-03 DIAGNOSIS — F902 Attention-deficit hyperactivity disorder, combined type: Secondary | ICD-10-CM | POA: Diagnosis not present

## 2023-02-03 MED ORDER — TRIAMCINOLONE ACETONIDE 0.1 % EX CREA: 1.0000 | TOPICAL_CREAM | Freq: Two times a day (BID) | CUTANEOUS | 1 refills | Status: DC

## 2023-02-03 MED ORDER — METHYLPHENIDATE HCL ER (LA) 20 MG PO CP24
20.0000 mg | ORAL_CAPSULE | ORAL | 0 refills | Status: DC
Start: 2023-02-03 — End: 2024-08-02

## 2023-02-03 NOTE — Patient Instructions (Signed)
A few things to remember from today's visit:  Attention deficit hyperactivity disorder (ADHD), combined type  Alopecia - Plan: TSH, Comprehensive metabolic panel, C-reactive protein, ANA, CBC, CBC, ANA, C-reactive protein, Comprehensive metabolic panel, TSH, triamcinolone cream (KENALOG) 0.1 %  Ritalin increased from 10 to 20 mg. Triamcinoone on affected area 2 times daily for 21 days.  If you need refills for medications you take chronically, please call your pharmacy. Do not use My Chart to request refills or for acute issues that need immediate attention. If you send a my chart message, it may take a few days to be addressed, specially if I am not in the office.  Please be sure medication list is accurate. If a new problem present, please set up appointment sooner than planned today.

## 2023-02-04 LAB — COMPREHENSIVE METABOLIC PANEL
ALT: 29 U/L (ref 0–53)
AST: 23 U/L (ref 0–37)
Albumin: 4.5 g/dL (ref 3.5–5.2)
Alkaline Phosphatase: 67 U/L (ref 39–117)
BUN: 15 mg/dL (ref 6–23)
CO2: 29 mEq/L (ref 19–32)
Calcium: 9.8 mg/dL (ref 8.4–10.5)
Chloride: 101 mEq/L (ref 96–112)
Creatinine, Ser: 0.86 mg/dL (ref 0.40–1.50)
GFR: 107.25 mL/min (ref >60.00)
Glucose, Bld: 89 mg/dL (ref 70–99)
Potassium: 4.2 mEq/L (ref 3.5–5.1)
Sodium: 139 mEq/L (ref 135–145)
Total Bilirubin: 1 mg/dL (ref 0.2–1.2)
Total Protein: 7.5 g/dL (ref 6.0–8.3)

## 2023-02-04 LAB — CBC
HCT: 46 % (ref 39.0–52.0)
Hemoglobin: 15.4 g/dL (ref 13.0–17.0)
MCHC: 33.5 g/dL (ref 30.0–36.0)
MCV: 85 fl (ref 78.0–100.0)
Platelets: 210 10*3/uL (ref 150.0–400.0)
RBC: 5.41 Mil/uL (ref 4.22–5.81)
RDW: 13.9 % (ref 11.5–15.5)
WBC: 6.8 10*3/uL (ref 4.0–10.5)

## 2023-02-04 LAB — C-REACTIVE PROTEIN: CRP: <1 mg/dL (ref 0.5–20.0)

## 2023-02-04 LAB — TSH: TSH: 1.67 u[IU]/mL (ref 0.35–5.50)

## 2023-02-05 LAB — ANA: Anti Nuclear Antibody (ANA): NEGATIVE

## 2023-02-07 NOTE — Assessment & Plan Note (Addendum)
Ritalin LA helped but effect did not last long. We discussed options, including increasing dose or changing to a different medication. He agrees with increasing Ritalin LA from 10 mg to 20 mg. We reviewed some side effects. Medication contract to be signed next visit, if we decide to continue treatment. F/U in 3 months.

## 2023-03-06 ENCOUNTER — Telehealth: Payer: Managed Care, Other (non HMO) | Admitting: Nurse Practitioner

## 2023-03-06 DIAGNOSIS — H00015 Hordeolum externum left lower eyelid: Secondary | ICD-10-CM

## 2023-03-06 MED ORDER — NEOMYCIN-POLYMYXIN-HC 3.5-10000-1 OP SUSP: 3.0000 [drp] | Freq: Four times a day (QID) | OPHTHALMIC | 0 refills | Status: DC

## 2023-03-06 NOTE — Progress Notes (Signed)
  E-Visit for Stye   We are sorry that you are not feeling well. Here is how we plan to help!  Based on what you have shared with me it looks like you have a stye.  A stye is an inflammation of the eyelid.  It is often a red, painful lump near the edge of the eyelid that may look like a boil or a pimple.  A stye develops when an infection occurs at the base of an eyelash.   We have made appropriate suggestions for you based upon your presentation: Your symptoms may indicate an infection of the sclera.  The use of anti-inflammatory and antibiotic eye drops for a week will help resolve this condition.  I have sent in neomycin-polymyxin HC opthalmic suspension, two to three drops in the affected eye every 4 hours.  If your symptoms do not improve over the next two to three days you should be seen in your doctor's office.  HOME CARE:  Wash your hands often! Let the stye open on its own. Don't squeeze or open it. Don't rub your eyes. This can irritate your eyes and let in bacteria.  If you need to touch your eyes, wash your hands first. Don't wear eye makeup or contact lenses until the area has healed.  GET HELP RIGHT AWAY IF:  Your symptoms do not improve. You develop blurred or loss of vision. Your symptoms worsen (increased discharge, pain or redness).   Thank you for choosing an e-visit.  Your e-visit answers were reviewed by a board certified advanced clinical practitioner to complete your personal care plan. Depending upon the condition, your plan could have included both over the counter or prescription medications.  Please review your pharmacy choice. Make sure the pharmacy is open so you can pick up prescription now. If there is a problem, you may contact your provider through MyChart messaging and have the prescription routed to another pharmacy.  Your safety is important to us. If you have drug allergies check your prescription carefully.   For the next 24 hours you can use  MyChart to ask questions about today's visit, request a non-urgent call back, or ask for a work or school excuse. You will get an email in the next two days asking about your experience. I hope that your e-visit has been valuable and will speed your recovery.  Mary-Margaret Khaliel Morey, FNP   5-10 minutes spent reviewing and documenting in chart.  

## 2023-03-15 ENCOUNTER — Telehealth: Payer: Self-pay | Admitting: Family Medicine

## 2023-03-15 DIAGNOSIS — L659 Nonscarring hair loss, unspecified: Secondary | ICD-10-CM

## 2023-03-15 NOTE — Telephone Encounter (Signed)
Pt states he was last seen on 02/03/23.  Pt stated he was prescribed a steroid and used it or 3 weeks.  Pt says skin condition is now getting worse, and half his eyebrow is gone.   Pt is requesting a Dermatology referral or something else.  Pt is asking for any advice at all.

## 2023-03-15 NOTE — Telephone Encounter (Signed)
Ok for dermatology referral? Please advise per visit from 01/2023 note:  "Alopecia ? Alopecia areata affecting right eye brow. Differential Dx discussed. I do not think it is caused by Ritalin. Recommend trying topical Triamcinolone , small amount bid for 14 days. If not resolved or gets worse, will recommend dermatologist evaluation."

## 2023-03-16 NOTE — Telephone Encounter (Signed)
Dermatology referral has been placed. BJ

## 2023-08-04 ENCOUNTER — Ambulatory Visit: Payer: Managed Care, Other (non HMO) | Admitting: Dermatology

## 2023-08-30 ENCOUNTER — Telehealth: Payer: Managed Care, Other (non HMO) | Admitting: Physician Assistant

## 2023-08-30 DIAGNOSIS — J069 Acute upper respiratory infection, unspecified: Secondary | ICD-10-CM

## 2023-08-30 MED ORDER — ALBUTEROL SULFATE HFA 108 (90 BASE) MCG/ACT IN AERS: 2.0000 | INHALATION_SPRAY | Freq: Four times a day (QID) | RESPIRATORY_TRACT | 0 refills | Status: DC | PRN

## 2023-08-30 MED ORDER — BENZONATATE 100 MG PO CAPS: 100.0000 mg | ORAL_CAPSULE | Freq: Three times a day (TID) | ORAL | 0 refills | Status: DC | PRN

## 2023-08-30 NOTE — Progress Notes (Signed)
Virtual Visit Consent   Darius Stone, you are scheduled for a virtual visit with a [facility] provider today. Just as with appointments in the office, your consent must be obtained to participate. Your consent will be active for this visit and any virtual visit you may have with one of our providers in the next 365 days. If you have a MyChart account, a copy of this consent can be sent to you electronically.  As this is a virtual visit, video technology does not allow for your provider to perform a traditional examination. This may limit your provider's ability to fully assess your condition. If your provider identifies any concerns that need to be evaluated in person or the need to arrange testing (such as labs, EKG, etc.), we will make arrangements to do so. Although advances in technology are sophisticated, we cannot ensure that it will always work on either your end or our end. If the connection with a video visit is poor, the visit may have to be switched to a telephone visit. With either a video or telephone visit, we are not always able to ensure that we have a secure connection.  By engaging in this virtual visit, you consent to the provision of healthcare and authorize for your insurance to be billed (if applicable) for the services provided during this visit. Depending on your insurance coverage, you may receive a charge related to this service.  I need to obtain your verbal consent now. Are you willing to proceed with your visit today? [name] has provided verbal consent on 08/30/2023 for a virtual visit (video or telephone). Darius Stone, [place]  Date: 08/30/2023 2:52 PM  Virtual Visit via Video Note   I, Darius Stone, connected with  Darius Stone  ([id number], [birth year]) on 08/30/23 at  2:45 PM EST by a video-enabled telemedicine application and verified that I am speaking with the correct person using two identifiers.  Location: Patient: Virtual Visit Location  Patient: Home Provider: Virtual Visit Location Provider: Home Office   I discussed the limitations of evaluation and management by telemedicine and the availability of in person appointments. The patient expressed understanding and agreed to proceed.    History of Present Illness: Darius Stone is a 42 y.o. who identifies as a male who was assigned male at birth, and is being seen today for cough and congestion starting over the past 3.5 days. Notes very faint wheeze sometimes with deep breaths. Cough is mainly with deep breaths. Congestion is hard to get up, per patient. Denies sinus pressure or congestion. Denies chest pain or SOB.  Denies fever, chills, aches. Notes family members with similar symptoms.   OTC -- Airborne  HPI: HPI  Problems:  Patient Active Problem List   Diagnosis Date Noted   Attention deficit hyperactivity disorder (ADHD), combined type 01/01/2023   GERD (gastroesophageal reflux disease) 03/11/2020   Class 1 obesity with body mass index (BMI) of 34.0 to 34.9 in adult 03/11/2020   Migraine headache 02/13/2014   Routine general medical examination at a health care facility 04/07/2012   BACK PAIN, THORACIC REGION 05/06/2009   DISC DISEASE, LUMBAR 03/05/2009    Allergies:  Allergies  Allergen Reactions   Penicillins     Unknown reaction   Medications:  Current Outpatient Medications:    albuterol (VENTOLIN HFA) 108 (90 Base) MCG/ACT inhaler, Inhale 2 puffs into the lungs every 6 (six) hours as needed for wheezing or shortness of breath., Disp: 8  g, Rfl: 0   benzonatate (TESSALON) 100 MG capsule, Take 1 capsule (100 mg total) by mouth 3 (three) times daily as needed for cough., Disp: 30 capsule, Rfl: 0   methylphenidate (RITALIN LA) 20 MG 24 hr capsule, Take 1 capsule (20 mg total) by mouth every morning., Disp: 30 capsule, Rfl: 0   neomycin-polymyxin-hydrocortisone (CORTISPORIN) 3.5-10000-1 ophthalmic suspension, Place 3 drops into the left eye 4 (four) times  daily., Disp: 7.5 mL, Rfl: 0   triamcinolone cream (KENALOG) 0.1 %, Apply 1 Application topically 2 (two) times daily., Disp: 30 g, Rfl: 1  Observations/Objective: Patient is well-developed, well-nourished in no acute distress.  Resting comfortably  at home.  Head is normocephalic, atraumatic.  No labored breathing.  Speech is clear and coherent with logical content.  Patient is alert and oriented at baseline.   Assessment and Plan: 1. Viral URI - albuterol (VENTOLIN HFA) 108 (90 Base) MCG/ACT inhaler; Inhale 2 puffs into the lungs every 6 (six) hours as needed for wheezing or shortness of breath.  Dispense: 8 g; Refill: 0 - benzonatate (TESSALON) 100 MG capsule; Take 1 capsule (100 mg total) by mouth 3 (three) times daily as needed for cough.  Dispense: 30 capsule; Refill: 0  Supportive measures and OTC medications reviewed. Albuterol and Tessalon per orders. Strict follow-up precautions reviewed.   Follow Up Instructions: I discussed the assessment and treatment plan with the patient. The patient was provided an opportunity to ask questions and all were answered. The patient agreed with the plan and demonstrated an understanding of the instructions.  A copy of instructions were sent to the patient via MyChart unless otherwise noted below.   The patient was advised to call back or seek an in-person evaluation if the symptoms worsen or if the condition fails to improve as anticipated.    Darius Climes, PA-C

## 2023-08-30 NOTE — Patient Instructions (Signed)
[name], thank you for joining [name], PA-C for today's virtual visit.  While this provider is not your primary care provider (PCP), if your PCP is located in our provider database this encounter information will be shared with them immediately following your visit.   A [facility] MyChart account gives you access to today's visit and all your visits, tests, and labs performed at [facility] " click here if you don't have a [facility] MyChart account or go to [email or web address]  Consent: (Patient) Jenelle Mages Alvira provided verbal consent for this virtual visit at the beginning of the encounter.  Current Medications:  Current Outpatient Medications:    methylphenidate (RITALIN LA) 20 MG 24 hr capsule, Take 1 capsule (20 mg total) by mouth every morning., Disp: 30 capsule, Rfl: 0   neomycin-polymyxin-hydrocortisone (CORTISPORIN) 3.5-10000-1 ophthalmic suspension, Place 3 drops into the left eye 4 (four) times daily., Disp: 7.5 mL, Rfl: 0   triamcinolone cream (KENALOG) 0.1 %, Apply 1 Application topically 2 (two) times daily., Disp: 30 g, Rfl: 1   Medications ordered in this encounter:  No orders of the defined types were placed in this encounter.    *If you need refills on other medications prior to your next appointment, please contact your pharmacy*  Follow-Up: Call back or seek an in-person evaluation if the symptoms worsen or if the condition fails to improve as anticipated.  [facility] [phone]  Other Instructions   We are sorry you are not feeling well.  Here is how we plan to help!  Based on what you have shared with me, it looks like you may have a viral upper respiratory infection.  Upper respiratory infections are caused by a large number of viruses; however, rhinovirus is the most common cause.   Symptoms vary from person to person, with common symptoms including sore throat, cough, fatigue or lack of energy and  feeling of general discomfort.  A low-grade fever of up to 100.4 may present, but is often uncommon.  Symptoms vary however, and are closely related to a person's age or underlying illnesses.  The most common symptoms associated with an upper respiratory infection are nasal discharge or congestion, cough, sneezing, headache and pressure in the ears and face.  These symptoms usually persist for about 3 to 10 days, but can last up to 2 weeks.  It is important to know that upper respiratory infections do not cause serious illness or complications in most cases.    Upper respiratory infections can be transmitted from person to person, with the most common method of transmission being a person's hands.  The virus is able to live on the skin and can infect other persons for up to 2 hours after direct contact.  Also, these can be transmitted when someone coughs or sneezes; thus, it is important to cover the mouth to reduce this risk.  To keep the spread of the illness at bay, good hand hygiene is very important.  This is an infection that is most likely caused by a virus. There are no specific treatments other than to help you with the symptoms until the infection runs its course.  We are sorry you are not feeling well.  Here is how we plan to help!   For nasal congestion, you may use an oral decongestants such as Mucinex D or if you have glaucoma or high blood pressure use plain Mucinex.  Saline nasal spray or nasal drops can help and  can safely be used as often as needed for congestion.    If you do not have a history of heart disease, hypertension, diabetes or thyroid disease, prostate/bladder issues or glaucoma, you may also use Sudafed to treat nasal congestion.  It is highly recommended that you consult with a pharmacist or your primary care physician to ensure this medication is safe for you to take.     If you have a cough, you may use cough suppressants such as Delsym and Robitussin.  If you have  glaucoma or high blood pressure, you can also use Coricidin HBP.   For cough I have prescribed for you A prescription cough medication called Tessalon Perles 100 mg. You may take 1-2 capsules every 8 hours as needed for cough  I have also sent in an albuterol inhaler to use as directed.  If you have a sore or scratchy throat, use a saltwater gargle-  to  teaspoon of salt dissolved in a 4-ounce to 8-ounce glass of warm water.  Gargle the solution for approximately 15-30 seconds and then spit.  It is important not to swallow the solution.  You can also use throat lozenges/cough drops and Chloraseptic spray to help with throat pain or discomfort.  Warm or cold liquids can also be helpful in relieving throat pain.  For headache, pain or general discomfort, you can use Ibuprofen or Tylenol as directed.   Some authorities believe that zinc sprays or the use of Echinacea may shorten the course of your symptoms.   HOME CARE Only take medications as instructed by your medical team. Be sure to drink plenty of fluids. Water is fine as well as fruit juices, sodas and electrolyte beverages. You may want to stay away from caffeine or alcohol. If you are nauseated, try taking small sips of liquids. How do you know if you are getting enough fluid? Your urine should be a pale yellow or almost colorless. Get rest. Taking a steamy shower or using a humidifier may help nasal congestion and ease sore throat pain. You can place a towel over your head and breathe in the steam from hot water coming from a faucet. Using a saline nasal spray works much the same way. Cough drops, hard candies and sore throat lozenges may ease your cough. Avoid close contacts especially the very young and the elderly Cover your mouth if you cough or sneeze Always remember to wash your hands.   GET HELP RIGHT AWAY IF: You develop worsening fever. If your symptoms do not improve within 10 days You develop yellow or green discharge from  your nose over 3 days. You have coughing fits You develop a severe head ache or visual changes. You develop shortness of breath, difficulty breathing or start having chest pain Your symptoms persist after you have completed your treatment plan  MAKE SURE YOU  Understand these instructions. Will watch your condition. Will get help right away if you are not doing well or get worse.       If you have been instructed to have an in-person evaluation today at a local Urgent Care facility, please use the link below. It will take you to a list of all of our available [facility], including address, phone number and hours of operation. Please do not delay care.  [facility]  If you or a family member do not have a primary care provider, use the link below to schedule a visit and establish care. When you  choose a [facility] primary care physician or advanced practice provider, you gain a long-term partner in health. Find a Primary Care Provider  Learn more about [facility]'s in-office and virtual care options: [facility] Get Care Now

## 2024-07-27 ENCOUNTER — Ambulatory Visit: Payer: Self-pay

## 2024-07-27 NOTE — Telephone Encounter (Signed)
 FYI Only or Action Required?: FYI only for provider: appointment scheduled on 10/31.  Patient was last seen in primary care on 08/30/2023 by Gladis Elsie BROCKS, PA-C.  Called Nurse Triage reporting Arm Pain.  Symptoms began about a month ago.  Interventions attempted: OTC medications: Motrin .  Symptoms are: gradually worsening.  Triage Disposition: See PCP When Office is Open (Within 3 Days)  Patient/caregiver understands and will follow disposition?: Yes            Copied from CRM #[id number]. Topic: Clinical - Red Word Triage >> Jul 27, 2024 10:40 AM Alfonso HERO wrote: Red Word that prompted transfer to Nurse Triage: hurt his arm working out pain getting worse Reason for Disposition  [1] MODERATE pain (e.g., interferes with normal activities) AND [2] present > 3 days  Answer Assessment - Initial Assessment Questions 1. ONSET: When did the pain start?     X 1 month ago   2. LOCATION: Where is the pain located?     Right arm   3. PAIN: How bad is the pain? (Scale 0-10; or none, mild, moderate, severe)     Dull ache, with movement pain worsens. 5/10  4. WORK OR EXERCISE: Has there been any recent work or exercise that involved this part of the body?      Yes while exercising in the Gym. He has stopped going to the gym over the past week, however the pain is persisting   5. CAUSE: What do you think is causing the arm pain?      Yes possible pulled muscle   6. OTHER SYMPTOMS: Do you have any other symptoms? (e.g., neck pain, swelling, rash, fever, numbness, weakness)  Mild weakness in arm.      For the pain the patient has taken Motrin , and Goodies. Appt. Scheduled for evaluation.  Protocols used: Arm Pain-A-AH

## 2024-07-28 ENCOUNTER — Encounter: Payer: Self-pay | Admitting: Family Medicine

## 2024-07-28 ENCOUNTER — Ambulatory Visit: Admitting: Family Medicine

## 2024-07-28 VITALS — BP 120/70 | HR 72 | Temp 98.2°F | Ht 75.0 in | Wt 255.0 lb

## 2024-07-28 DIAGNOSIS — S46391A Other injury of muscle, fascia and tendon of triceps, right arm, initial encounter: Secondary | ICD-10-CM

## 2024-07-28 NOTE — Progress Notes (Signed)
 Established Patient Office Visit  Subjective   Patient ID: Darius Stone, male    DOB: 16-Sep-1981  Age: 43 y.o. MRN: [id number]  Chief Complaint  Patient presents with   Arm Pain    HPI   Darius Stone is seen with right arm pain for about a month and a half.  He was doing some incline dumbbells when he felt sudden pain right triceps about a month and a half ago.  He tried taking a week off initially and then another week off this past week and altering his weight routine for several weeks now without any real improvement.  Does not recall any visible bruising or obvious swelling after the initial injury.  Pain has been mostly proximal to mid triceps region.  Sometimes worse with elbow extension.  He is having significant pain at night especially when sleeping on his side.  He has taken occasional over-the-counter anti-inflammatory.  Denies any neck pain.  For persistent pain now for 6 weeks has definitely limited his lifting routine.  Pain seems to be consistently reproduced with triceps use.  Past Medical History:  Diagnosis Date   Panic attack    Past Surgical History:  Procedure Laterality Date   HERNIA REPAIR     TONSILLECTOMY     TOTAL HIP ARTHROPLASTY Left 07/22/2018   TYMPANOPLASTY WITH GRAFT Right    per Dr. Medford Angle    TYMPANOSTOMY TUBE PLACEMENT      reports that he has quit smoking. He has never used smokeless tobacco. He reports current alcohol use. He reports current drug use. family history includes Alcohol abuse in his father; Cancer in his sister. Allergies  Allergen Reactions   Penicillins     Unknown reaction    Review of Systems  Musculoskeletal:  Negative for neck pain.      Objective:     BP 120/70   Pulse 72   Temp 98.2 F (36.8 C) (Oral)   Ht 6' 3 (1.905 m)   Wt 255 lb (115.7 kg)   SpO2 98%   BMI 31.87 kg/m  BP Readings from Last 3 Encounters:  07/28/24 120/70  02/03/23 128/80  12/15/22 128/80   Wt Readings from Last 3 Encounters:   07/28/24 255 lb (115.7 kg)  02/03/23 294 lb 2 oz (133.4 kg)  12/15/22 298 lb 4 oz (135.3 kg)      Physical Exam Vitals reviewed.  Constitutional:      General: He is not in acute distress.    Appearance: He is not ill-appearing.  Cardiovascular:     Rate and Rhythm: Normal rate and regular rhythm.  Musculoskeletal:     Comments: Right upper extremity reveals no obvious swelling.  He has good range of motion right shoulder.  No pain with abduction or internal rotation.  No biceps tenderness.  No acromioclavicular tenderness.  Minimal pain with elbow extension against resistance.  Neurological:     Mental Status: He is alert.      No results found for any visits on 07/28/24.    The ASCVD Risk score (Arnett DK, et al., 2019) failed to calculate for the following reasons:   Cannot find a previous HDL lab   Cannot find a previous total cholesterol lab    Assessment & Plan:   Problem List Items Addressed This Visit   None Visit Diagnoses       Other injury of muscle, fascia and tendon of triceps, right arm, initial encounter    -  Primary  Relevant Orders   Ambulatory referral to Sports Medicine     Patient relates 6 weeks of right posterior arm pain following lifting exercise 6 weeks ago..  Suspect triceps injury.  Not improving after several weeks of relative rest.  Recommend sports medicine referral and patient agrees.  No follow-ups on file.    Wolm Scarlet, MD

## 2024-07-28 NOTE — Patient Instructions (Signed)
 May take up to 800 mg Motrin  every 8 hours as needed.

## 2024-08-02 ENCOUNTER — Other Ambulatory Visit: Payer: Self-pay

## 2024-08-02 ENCOUNTER — Ambulatory Visit

## 2024-08-02 ENCOUNTER — Encounter: Payer: Self-pay | Admitting: Family Medicine

## 2024-08-02 ENCOUNTER — Ambulatory Visit: Admitting: Family Medicine

## 2024-08-02 VITALS — BP 118/84 | HR 66 | Ht 75.0 in | Wt 257.0 lb

## 2024-08-02 DIAGNOSIS — M79601 Pain in right arm: Secondary | ICD-10-CM

## 2024-08-02 DIAGNOSIS — G8929 Other chronic pain: Secondary | ICD-10-CM

## 2024-08-02 DIAGNOSIS — Z85820 Personal history of malignant melanoma of skin: Secondary | ICD-10-CM | POA: Diagnosis not present

## 2024-08-02 DIAGNOSIS — Z9889 Other specified postprocedural states: Secondary | ICD-10-CM

## 2024-08-02 DIAGNOSIS — M25511 Pain in right shoulder: Secondary | ICD-10-CM

## 2024-08-02 NOTE — Patient Instructions (Addendum)
 Thank you for coming in today.   Please work on the home exercises the athletic trainer went over with you:  View at [email or web address] code [email or web address]  Please get an Xray today before you leave   Let me know how it goes

## 2024-08-02 NOTE — Progress Notes (Unsigned)
   I, Leotis Batter, CMA acting as a scribe for Artist Lloyd, MD.  Darius Stone is a 43 y.o. male who presents to [facility] at [facility] today for R arm pain x almost 1.5 months. He was doing some incline dumbbells bench press when he felt sudden pain in his R triceps. Was unable to lift lightweight after. Took a week off but sx did not improve. Then took another week off but feels that sx have progressively worsened. Was having throbbing pain at rest, this has resolved.   Aggravates: lifting, throwing, reaching, weighted extension Treatments tried: rest, heat, stretching, ice  Pertinent review of systems: No fevers or chills  Relevant historical information: GERD History of melanoma  Exam:  BP 118/84   Pulse 66   Ht 6' 3 (1.905 m)   Wt 257 lb (116.6 kg)   SpO2 99%   BMI 32.12 kg/m  General: Well Developed, well nourished, and in no acute distress.   MSK: Right shoulder normal appearing normal motion some pain with abduction and functional internal rotation. Strength is intact pain with resisted abduction. Positive Hawkins and Neer's test.  Negative Yergason's and speeds test. Isolated triceps resistance testing reveals normal strength without significant pain.    Lab and Radiology Results  Diagnostic Limited MSK Ultrasound of: Right shoulder Biceps tendon intact. Subscapularis tendon is intact. Supraspinatus tendon is intact with moderate subacromial bursitis. Infraspinatus tendon is intact. AC joint normal-appearing Impression: Subacromial bursitis  X-ray images right shoulder obtained today personally and independently interpreted. No acute fractures.  No severe degenerative changes. Await formal radiology review    Assessment and Plan: 43 y.o. male with right upper arm pain 2 predominantly to shoulder etiology including rotator cuff tendinopathy impingement and subacromial bursitis.  Plan for home exercise program and modification of weight  lifting activity.  Will proceed to physical therapy if not improving.  Check back as needed.  X-ray reassuring per my read today.  Radiology overread is still pending.   PDMP not reviewed this encounter. Orders Placed This Encounter  Procedures   US  LIMITED JOINT SPACE STRUCTURES UP RIGHT(NO LINKED CHARGES)    Reason for Exam (SYMPTOM  OR DIAGNOSIS REQUIRED):   right arm pain    Preferred imaging location?:   [facility] Shoulder Right    Standing Status:   Future    Number of Occurrences:   1    Expiration Date:   08/02/2025    Reason for Exam (SYMPTOM  OR DIAGNOSIS REQUIRED):   right shoulder pain    Preferred imaging location?:   [facility]   No orders of the defined types were placed in this encounter.    Discussed warning signs or symptoms. Please see discharge instructions. Patient expresses understanding.   The above documentation has been reviewed and is accurate and complete Artist Lloyd, M.D.

## 2024-08-03 DIAGNOSIS — Z85820 Personal history of malignant melanoma of skin: Secondary | ICD-10-CM | POA: Insufficient documentation

## 2024-08-08 ENCOUNTER — Ambulatory Visit: Payer: Self-pay | Admitting: Family Medicine

## 2024-08-08 NOTE — Progress Notes (Signed)
 Right shoulder x-ray looks okay to radiology.  No arthritis or fracture.

## 2024-10-12 ENCOUNTER — Ambulatory Visit: Admitting: Family Medicine

## 2024-10-12 ENCOUNTER — Other Ambulatory Visit: Payer: Self-pay

## 2024-10-12 VITALS — BP 128/88 | HR 76 | Ht 75.0 in | Wt 263.0 lb

## 2024-10-12 DIAGNOSIS — G8929 Other chronic pain: Secondary | ICD-10-CM | POA: Diagnosis not present

## 2024-10-12 DIAGNOSIS — M25511 Pain in right shoulder: Secondary | ICD-10-CM

## 2024-10-12 NOTE — Patient Instructions (Addendum)
 Thank you for coming in today.   You received an injection today. Seek immediate medical attention if the joint becomes red, extremely painful, or is oozing fluid.   Let me know if this shot doesn't work/last and I will order a MRI

## 2024-10-12 NOTE — Progress Notes (Signed)
"       ° °  LILLETTE Ileana Collet, PhD, LAT, ATC acting as a scribe for Artist Lloyd, MD.  ELION HOCKER is a 44 y.o. male who presents to [facility] at [facility] today for cont'd R shoulder pain. Pt was last seen by Dr. Lloyd on 08/02/24 and was advised on modified weight lifting and taught HEP.  Today, pt reports he work on [employer] for a bit, but stopped do to not seeing improvement. He cont'd going to the gym, but modified lifting.  Dx imaging: 08/02/24 R shoulder XR  Pertinent review of systems: No fevers or chills  Relevant historical information: Migraine headache.  ADHD.   Exam:  BP 128/88   Pulse 76   Ht 6' 3 (1.905 m)   Wt 263 lb (119.3 kg)   SpO2 96%   BMI 32.87 kg/m  General: Well Developed, well nourished, and in no acute distress.   MSK: Right shoulder well-developed musculature normal-appearing otherwise. Normal motion pain with abduction.   Positive Hawkins and Neer's test negative Yergason's and speeds test. Intact strength.    Lab and Radiology Results  Procedure: Real-time Ultrasound Guided Injection of right shoulder subacromial bursa Device: Philips Affiniti 50G/GE Logiq Images permanently stored and available for review in PACS Verbal informed consent obtained.  Discussed risks and benefits of procedure. Warned about infection, bleeding, hyperglycemia damage to structures among others. Patient expresses understanding and agreement Time-out conducted.   Noted no overlying erythema, induration, or other signs of local infection.   Skin prepped in a sterile fashion.   Local anesthesia: Topical Ethyl chloride.   With sterile technique and under real time ultrasound guidance: 40 mg of Kenalog  and 2 mL of Marcaine injected into subacromial bursa. Fluid seen entering the bursa.   Completed without difficulty   Pain immediately resolved suggesting accurate placement of the medication.   Advised to call if fevers/chills, erythema, induration, drainage, or  persistent bleeding.   Images permanently stored and available for review in the ultrasound unit.  Impression: Technically successful ultrasound guided injection.       Assessment and Plan: 44 y.o. male with right shoulder pain due to subacromial impingement and bursitis.  Plan for steroid injection today. Patient has completed home exercise program already which unfortunately did not work well enough.  If not improved we can proceed to MRI.  PDMP not reviewed this encounter. Orders Placed This Encounter  Procedures   US  LIMITED JOINT SPACE STRUCTURES UP RIGHT(NO LINKED CHARGES)    Reason for Exam (SYMPTOM  OR DIAGNOSIS REQUIRED):   right shoulder pain    Preferred imaging location?:   [facility]   No orders of the defined types were placed in this encounter.    Discussed warning signs or symptoms. Please see discharge instructions. Patient expresses understanding.   The above documentation has been reviewed and is accurate and complete Artist Lloyd, M.D.   "
